# Patient Record
Sex: Female | Born: 1940
Health system: Southern US, Community
[De-identification: ages and names within clinical notes are randomized; demographics above are authoritative.]

## PROBLEM LIST (undated history)

## (undated) DIAGNOSIS — K5732 Diverticulitis of large intestine without perforation or abscess without bleeding: Secondary | ICD-10-CM

## (undated) DIAGNOSIS — R0602 Shortness of breath: Secondary | ICD-10-CM

## (undated) DIAGNOSIS — K219 Gastro-esophageal reflux disease without esophagitis: Secondary | ICD-10-CM

## (undated) DIAGNOSIS — R0600 Dyspnea, unspecified: Secondary | ICD-10-CM

## (undated) DIAGNOSIS — M1990 Unspecified osteoarthritis, unspecified site: Secondary | ICD-10-CM

## (undated) DIAGNOSIS — N6019 Diffuse cystic mastopathy of unspecified breast: Secondary | ICD-10-CM

## (undated) DIAGNOSIS — R9439 Abnormal result of other cardiovascular function study: Secondary | ICD-10-CM

## (undated) DIAGNOSIS — M8580 Other specified disorders of bone density and structure, unspecified site: Secondary | ICD-10-CM

## (undated) DIAGNOSIS — G459 Transient cerebral ischemic attack, unspecified: Secondary | ICD-10-CM

## (undated) DIAGNOSIS — Z860100 Personal history of colon polyps, unspecified: Secondary | ICD-10-CM

## (undated) DIAGNOSIS — L719 Rosacea, unspecified: Secondary | ICD-10-CM

## (undated) DIAGNOSIS — C801 Malignant (primary) neoplasm, unspecified: Secondary | ICD-10-CM

## (undated) DIAGNOSIS — I10 Essential (primary) hypertension: Secondary | ICD-10-CM

## (undated) DIAGNOSIS — R0609 Other forms of dyspnea: Secondary | ICD-10-CM

## (undated) DIAGNOSIS — E785 Hyperlipidemia, unspecified: Secondary | ICD-10-CM

## (undated) HISTORY — DX: Dyspnea, unspecified: R06.00

## (undated) HISTORY — DX: Essential (primary) hypertension: I10

## (undated) HISTORY — DX: Shortness of breath: R06.02

## (undated) HISTORY — DX: Gastro-esophageal reflux disease without esophagitis: K21.9

## (undated) HISTORY — DX: Diffuse cystic mastopathy of unspecified breast: N60.19

## (undated) HISTORY — DX: Other specified disorders of bone density and structure, unspecified site: M85.80

## (undated) HISTORY — DX: Unspecified osteoarthritis, unspecified site: M19.90

## (undated) HISTORY — DX: Transient cerebral ischemic attack, unspecified: G45.9

## (undated) HISTORY — DX: Abnormal result of other cardiovascular function study: R94.39

## (undated) HISTORY — PX: BREAST SURGERY: SHX581

## (undated) HISTORY — DX: Rosacea, unspecified: L71.9

## (undated) HISTORY — PX: BREAST ENHANCEMENT SURGERY: SHX7

## (undated) HISTORY — DX: Other forms of dyspnea: R06.09

## (undated) HISTORY — PX: MASTECTOMY: SHX3

## (undated) HISTORY — DX: Malignant (primary) neoplasm, unspecified: C80.1

## (undated) HISTORY — DX: Hyperlipidemia, unspecified: E78.5

## (undated) HISTORY — DX: Diverticulitis of large intestine without perforation or abscess without bleeding: K57.32

## (undated) HISTORY — DX: Personal history of colonic polyps: Z86.010

## (undated) HISTORY — DX: Personal history of colon polyps, unspecified: Z86.0100

---

## 1989-05-06 HISTORY — PX: BREAST LUMPECTOMY: SHX2

## 1997-09-19 ENCOUNTER — Ambulatory Visit (HOSPITAL_BASED_OUTPATIENT_CLINIC_OR_DEPARTMENT_OTHER): Admission: RE | Admit: 1997-09-19 | Discharge: 1997-09-19 | Payer: Self-pay

## 1997-09-22 ENCOUNTER — Encounter: Admission: RE | Admit: 1997-09-22 | Discharge: 1997-12-21 | Payer: Self-pay | Admitting: *Deleted

## 1997-09-30 ENCOUNTER — Ambulatory Visit (HOSPITAL_COMMUNITY): Admission: RE | Admit: 1997-09-30 | Discharge: 1997-10-01 | Payer: Self-pay

## 1998-01-27 ENCOUNTER — Ambulatory Visit (HOSPITAL_BASED_OUTPATIENT_CLINIC_OR_DEPARTMENT_OTHER): Admission: RE | Admit: 1998-01-27 | Discharge: 1998-01-27 | Payer: Self-pay | Admitting: Specialist

## 1998-05-03 ENCOUNTER — Emergency Department (HOSPITAL_COMMUNITY): Admission: EM | Admit: 1998-05-03 | Discharge: 1998-05-03 | Payer: Self-pay | Admitting: Emergency Medicine

## 1998-08-07 ENCOUNTER — Ambulatory Visit (HOSPITAL_BASED_OUTPATIENT_CLINIC_OR_DEPARTMENT_OTHER): Admission: RE | Admit: 1998-08-07 | Discharge: 1998-08-07 | Payer: Self-pay | Admitting: Specialist

## 1999-01-01 ENCOUNTER — Ambulatory Visit (HOSPITAL_BASED_OUTPATIENT_CLINIC_OR_DEPARTMENT_OTHER): Admission: RE | Admit: 1999-01-01 | Discharge: 1999-01-01 | Payer: Self-pay | Admitting: Specialist

## 1999-03-15 ENCOUNTER — Ambulatory Visit (HOSPITAL_BASED_OUTPATIENT_CLINIC_OR_DEPARTMENT_OTHER): Admission: RE | Admit: 1999-03-15 | Discharge: 1999-03-15 | Payer: Self-pay | Admitting: Plastic Surgery

## 2001-06-18 ENCOUNTER — Other Ambulatory Visit: Admission: RE | Admit: 2001-06-18 | Discharge: 2001-06-18 | Payer: Self-pay | Admitting: Gynecology

## 2002-06-24 ENCOUNTER — Other Ambulatory Visit: Admission: RE | Admit: 2002-06-24 | Discharge: 2002-06-24 | Payer: Self-pay | Admitting: Gynecology

## 2003-06-30 ENCOUNTER — Other Ambulatory Visit: Admission: RE | Admit: 2003-06-30 | Discharge: 2003-06-30 | Payer: Self-pay | Admitting: Gynecology

## 2003-07-21 ENCOUNTER — Encounter (INDEPENDENT_AMBULATORY_CARE_PROVIDER_SITE_OTHER): Payer: Self-pay | Admitting: Specialist

## 2003-07-21 ENCOUNTER — Ambulatory Visit (HOSPITAL_COMMUNITY): Admission: RE | Admit: 2003-07-21 | Discharge: 2003-07-21 | Payer: Self-pay | Admitting: Gastroenterology

## 2004-07-02 ENCOUNTER — Other Ambulatory Visit: Admission: RE | Admit: 2004-07-02 | Discharge: 2004-07-02 | Payer: Self-pay | Admitting: Gynecology

## 2005-07-03 ENCOUNTER — Other Ambulatory Visit: Admission: RE | Admit: 2005-07-03 | Discharge: 2005-07-03 | Payer: Self-pay | Admitting: Gynecology

## 2006-07-04 ENCOUNTER — Other Ambulatory Visit: Admission: RE | Admit: 2006-07-04 | Discharge: 2006-07-04 | Payer: Self-pay | Admitting: Gynecology

## 2007-07-06 ENCOUNTER — Other Ambulatory Visit: Admission: RE | Admit: 2007-07-06 | Discharge: 2007-07-06 | Payer: Self-pay | Admitting: Gynecology

## 2008-10-05 ENCOUNTER — Encounter: Payer: Self-pay | Admitting: Gynecology

## 2008-10-05 ENCOUNTER — Other Ambulatory Visit: Admission: RE | Admit: 2008-10-05 | Discharge: 2008-10-05 | Payer: Self-pay | Admitting: Gynecology

## 2008-10-05 ENCOUNTER — Ambulatory Visit: Payer: Self-pay | Admitting: Gynecology

## 2009-11-09 ENCOUNTER — Other Ambulatory Visit: Admission: RE | Admit: 2009-11-09 | Discharge: 2009-11-09 | Payer: Self-pay | Admitting: Gynecology

## 2009-11-09 ENCOUNTER — Ambulatory Visit: Payer: Self-pay | Admitting: Gynecology

## 2010-09-21 NOTE — Op Note (Signed)
NAME:  Andrea Landry, Andrea Landry                         ACCOUNT NO.:  [id number]   MEDICAL RECORD NO.:  [id number]                   PATIENT TYPE:  AMB   LOCATION:  ENDO                                 FACILITY:  [facility]   PHYSICIAN:  Danise Edge, M.D.                DATE OF BIRTH:  1940-08-18   DATE OF PROCEDURE:  07/21/2003  DATE OF DISCHARGE:                                 OPERATIVE REPORT   PROCEDURE:  Colonoscopy and rectal polypectomy.   PROCEDURE INDICATION:  Ms. Andrea Landry is a 70 year old female, born  1940-09-01.  To evaluate intermittent hematochezia, Dr. Johnella Moloney  performed  a diagnostic flexible proctosigmoidoscopy on Ms. Andrea Landry and  discovered a 1 cm polyp in the distal rectum.   ENDOSCOPIST:  Charolett Bumpers, M.D.   PREMEDICATION:  1. Versed 5 mg.  2. Demerol 50 mg.   DESCRIPTION OF PROCEDURE:  After obtaining informed consent, Ms. Andrea Landry was  placed in the left lateral decubitus position.  I administered intravenous  Demerol and intravenous Versed to achieve conscious sedation for the  procedure.  The patient's blood pressure, oxygen saturation, and cardiac  rhythm were monitored throughout the procedure and documented in the medical  record.   Anal inspection was normal.  Digital rectal exam was normal.  I was unable  to feel the distal rectal polyp.  The Olympus adjustable pediatric  colonoscope was introduced into the rectum and advanced to the cecum.  Colonic preparation for the exam today was excellent.   RECTUM:  In the distal rectum, approximately 8-10 cm from the anal verge,  there is a 1 cm broad-based polyp.  The polyp was removed by electrocautery  snare.  The base of the polyp was injected with saline.  A second specimen  was taken from the rectal polyp base to ensure complete removal of the  distal rectal polyp.  Specimens were sent and labeled distal rectal polyp  base.  SIGMOID COLON AND DESCENDING COLON:  Normal.  SPLENIC FLEXURE:   Normal.  TRANSVERSE COLON:  Normal.  HEPATIC FLEXURE:  Normal.  ASCENDING COLON:  Normal.  CECUM AND ILEOCECAL VALVE:  Normal.   ASSESSMENT:  A distal rectal polyp was removed with the snare.  The polyp  base was injected with saline and a second specimen from the polyp base was  sent separately.                                               Danise Edge, M.D.    MJ/MEDQ  D:  07/21/2003  T:  07/21/2003  Job:  [id number]   cc:   Candyce Churn, M.D.  [address] [place]  [place] [address]  Fax: [phone]

## 2011-01-04 DIAGNOSIS — M8580 Other specified disorders of bone density and structure, unspecified site: Secondary | ICD-10-CM | POA: Insufficient documentation

## 2011-01-04 DIAGNOSIS — I10 Essential (primary) hypertension: Secondary | ICD-10-CM | POA: Insufficient documentation

## 2011-01-04 DIAGNOSIS — C801 Malignant (primary) neoplasm, unspecified: Secondary | ICD-10-CM | POA: Insufficient documentation

## 2011-01-11 ENCOUNTER — Encounter: Payer: Self-pay | Admitting: Gynecology

## 2011-01-11 ENCOUNTER — Ambulatory Visit (INDEPENDENT_AMBULATORY_CARE_PROVIDER_SITE_OTHER): Payer: Medicare Other | Admitting: Gynecology

## 2011-01-11 VITALS — BP 130/80 | Ht 67.75 in | Wt 163.0 lb

## 2011-01-11 DIAGNOSIS — M8580 Other specified disorders of bone density and structure, unspecified site: Secondary | ICD-10-CM

## 2011-01-11 DIAGNOSIS — R82998 Other abnormal findings in urine: Secondary | ICD-10-CM

## 2011-01-11 DIAGNOSIS — Z853 Personal history of malignant neoplasm of breast: Secondary | ICD-10-CM

## 2011-01-11 DIAGNOSIS — M899 Disorder of bone, unspecified: Secondary | ICD-10-CM

## 2011-01-11 DIAGNOSIS — N952 Postmenopausal atrophic vaginitis: Secondary | ICD-10-CM

## 2011-01-11 DIAGNOSIS — R3915 Urgency of urination: Secondary | ICD-10-CM

## 2011-01-11 NOTE — Progress Notes (Signed)
Andrea Landry 03/16/1941 [id number]        70 y.o.  For followup. History of osteopenia, breast cancer. Patient is complaining of urgency during the day with urination multiple times. No significant incontinence only gets up once at night.  Past medical history,surgical history, medications, allergies, family history and social history were all reviewed and documented in the EPIC chart. ROS:  Was performed and pertinent positives and negatives are included in the history.  Exam: chaperone present Filed Vitals:   01/11/11 1549  BP: 130/80   General appearance  Normal Skin grossly normal Head/Neck normal with no cervical or supraclavicular adenopathy thyroid normal Lungs  clear Cardiac RR, without RMG Abdominal  soft, nontender, without masses, organomegaly or hernia Breasts  examined lying and sitting.  Left without masses, retractions, discharge or axillary adenopathy.  Right status post mastectomy with reconstruction no acute changes, masses or axillary adenopathy Pelvic  Ext/BUS/vagina  normal atrophic genital changes  Cervix  normal  atrophic  Uterus  anteverted, normal size, shape and contour, midline and mobile nontender   Adnexa  Without masses or tenderness    Anus and perineum  normal   Rectovaginal  normal sphincter tone without palpated masses or tenderness.    Assessment/Plan:  70 y.o. female followup. #1 History of osteopenia. Bone density study September 2010 showed osteopenia with a -1.5 the left femoral neck. She's been stable over the last number of years on repeat bone density and I recommended that we repeat this again in another year or 2. Increase calcium vitamin D discussed. #2 Breast cancer. Patient has no evidence of disease. She's due for her mammogram this month and knows to followup for this.  Self breast exams on the basis discussed encouraged. #3 Urgency symptoms. We'll check urinalysis today. She is on a diuretic for her hypertension. She only gets up  once at night for voiding. I discussed options to include trial of overactive bladder medication. She does not want to do this for fear of side effects. She is comfortable with just watching for now assuming that her urine is negative. #4 Atrophic vaginitis. Patient asymptomatic we'll continue to monitor. #5 Health maintenance. Patient will get her mammogram this month. Is up-to-date with colonoscopy that she arranges for Dr. Kevan Ny office. No blood work was done today this is all done through their office. I did not do a Pap smear today as she has had normal routine Pap smears in the past and never had a history of abnormality. She did have a normal Pap smear last year. I discussed the newer recommendations and discussed that we could stop screening since she is over age 13 without history of abnormal Pap smears. We'll rediscuss this next year.  Assuming she continues well from a gynecologic standpoint and she'll see [place] in a year.   Dara Lords MD, 4:40 PM 01/11/2011

## 2011-01-14 ENCOUNTER — Telehealth: Payer: Self-pay | Admitting: Gynecology

## 2011-01-14 DIAGNOSIS — N390 Urinary tract infection, site not specified: Secondary | ICD-10-CM

## 2011-01-14 MED ORDER — SULFAMETHOXAZOLE-TRIMETHOPRIM 800-160 MG PO TABS: 1.0000 | ORAL_TABLET | Freq: Two times a day (BID) | ORAL | Status: AC

## 2011-01-14 NOTE — Telephone Encounter (Signed)
Tell pt low level of bacteria in urine.  I want to cover with septra DS 1po bid X 3days

## 2011-01-15 NOTE — Telephone Encounter (Signed)
Pt informed with the below note. 

## 2011-01-15 NOTE — Telephone Encounter (Signed)
L/M FOR PT TO CALL.

## 2011-02-04 ENCOUNTER — Encounter: Payer: Self-pay | Admitting: Gynecology

## 2011-05-07 HISTORY — PX: OTHER SURGICAL HISTORY: SHX169

## 2011-11-13 ENCOUNTER — Other Ambulatory Visit: Payer: Self-pay | Admitting: Gastroenterology

## 2012-01-29 ENCOUNTER — Encounter: Payer: Self-pay | Admitting: Gynecology

## 2012-01-29 ENCOUNTER — Ambulatory Visit (INDEPENDENT_AMBULATORY_CARE_PROVIDER_SITE_OTHER): Payer: Medicare Other | Admitting: Gynecology

## 2012-01-29 VITALS — BP 120/72 | Ht 67.5 in | Wt 166.0 lb

## 2012-01-29 DIAGNOSIS — C50919 Malignant neoplasm of unspecified site of unspecified female breast: Secondary | ICD-10-CM

## 2012-01-29 DIAGNOSIS — M899 Disorder of bone, unspecified: Secondary | ICD-10-CM

## 2012-01-29 DIAGNOSIS — Z780 Asymptomatic menopausal state: Secondary | ICD-10-CM

## 2012-01-29 DIAGNOSIS — M8580 Other specified disorders of bone density and structure, unspecified site: Secondary | ICD-10-CM

## 2012-01-29 DIAGNOSIS — N952 Postmenopausal atrophic vaginitis: Secondary | ICD-10-CM

## 2012-01-29 DIAGNOSIS — M949 Disorder of cartilage, unspecified: Secondary | ICD-10-CM

## 2012-01-29 NOTE — Patient Instructions (Signed)
Follow up in one year for annual exam 

## 2012-01-29 NOTE — Progress Notes (Signed)
Andrea Landry 1940/06/12 [id number]        70 y.o.  G2P1002 for annual follow up exam.  Several issues noted below.  Past medical history,surgical history, medications, allergies, family history and social history were all reviewed and documented in the EPIC chart. ROS:  Was performed and pertinent positives and negatives are included in the history.  Exam: Andrea Landry assistant Filed Vitals:   01/29/12 0921  BP: 120/72  Height: 5' 7.5" (1.715 m)  Weight: 166 lb (75.297 kg)   General appearance  Normal Skin grossly normal Head/Neck normal with no cervical or supraclavicular adenopathy thyroid normal Lungs  clear Cardiac RR, without RMG Abdominal  soft, nontender, without masses, organomegaly or hernia Breasts  examined lying and sitting. Right status post mastectomy with reconstruction. Scars well healed. No visual/palpable abnormalities with implant noted. No axillary adenopathy. Left breast status post reduction well-healed scars. No masses, retractions, discharge, adenopathy. Pelvic  Ext/BUS/vagina  normal  With atrophic changes  Cervix  normal atrophic   Uterus  axial, normal size, shape and contour, midline and mobile nontender   Adnexa  Without masses or tenderness    Anus and perineum  normal   Rectovaginal  normal sphincter tone without palpated masses or tenderness.    Assessment/Plan:  71 y.o. G2P1002 female .   1. Postmenopausal/atrophic vaginitis. Without significant symptoms. We'll continue to monitor. 2. History of breast cancer. Exam is normal status post mastectomy/reconstruction.  Do from a mammogram next month and knows to schedule this. SBE reviewed. 3. Pap smear. No Pap smear done today. Last Pap smear 2011. Numerous normal reports in her chart with no history of abnormal Pap smears before. Review current screening guidelines and will stop screening as she is over the age of 64. 4. Osteopenia. DEXA 01/2009 at [facility] shows T score -1.5 no FRAX.  Recommend repeat now a  3 year interval and she agrees to arrange this with her mammogram next month. Increase calcium vitamin D reviewed. 5. Colonoscopy. Patient had colonoscopy this past year which was normal. 6. Health maintenance. No blood work was done today this is all done through her primary physician's office. Follow up one year, sooner as needed.    Dara Lords MD, 10:21 AM 01/29/2012

## 2012-02-26 ENCOUNTER — Encounter: Payer: Self-pay | Admitting: Gynecology

## 2012-03-25 ENCOUNTER — Encounter: Payer: Self-pay | Admitting: Gynecology

## 2013-02-01 ENCOUNTER — Ambulatory Visit (INDEPENDENT_AMBULATORY_CARE_PROVIDER_SITE_OTHER): Payer: Medicare Other | Admitting: Gynecology

## 2013-02-01 ENCOUNTER — Encounter: Payer: Self-pay | Admitting: Gynecology

## 2013-02-01 VITALS — BP 120/70 | Ht 67.0 in | Wt 164.0 lb

## 2013-02-01 DIAGNOSIS — M8580 Other specified disorders of bone density and structure, unspecified site: Secondary | ICD-10-CM

## 2013-02-01 DIAGNOSIS — N952 Postmenopausal atrophic vaginitis: Secondary | ICD-10-CM

## 2013-02-01 DIAGNOSIS — C50911 Malignant neoplasm of unspecified site of right female breast: Secondary | ICD-10-CM

## 2013-02-01 DIAGNOSIS — C50919 Malignant neoplasm of unspecified site of unspecified female breast: Secondary | ICD-10-CM

## 2013-02-01 DIAGNOSIS — M899 Disorder of bone, unspecified: Secondary | ICD-10-CM

## 2013-02-01 NOTE — Patient Instructions (Signed)
Follow up in one year for annual exam 

## 2013-02-01 NOTE — Progress Notes (Signed)
  Andrea Landry Jul 02, 1940 [id number]        72 y.o.  G3P0102 for followup exam. Several issues noted below.  Past medical history,surgical history, medications, allergies, family history and social history were all reviewed and documented in the EPIC chart.  ROS:  Performed and pertinent positives and negatives are included in the history, assessment and plan .  Exam: Kim assistant Filed Vitals:   02/01/13 1428  BP: 120/70  Height: 5\' 7"  (1.702 m)  Weight: 164 lb (74.39 kg)   General appearance  Normal Skin grossly normal Head/Neck normal with no cervical or supraclavicular adenopathy thyroid normal Lungs  clear Cardiac RR, without RMG Abdominal  soft, nontender, without masses, organomegaly or hernia Breasts  examined lying and sitting. Left without masses, retractions, discharge or axillary adenopathy. Well-healed reduction scars. Right status post mastectomy with reconstruction. No masses or axillary adenopathy. Pelvic  Ext/BUS/vagina  normal with atrophic changes  Cervix  normal with atrophic changes  Uterus  anteverted, normal size, shape and contour, midline and mobile nontender   Adnexa  Without masses or tenderness    Anus and perineum  normal   Rectovaginal  normal sphincter tone without palpated masses or tenderness.    Assessment/Plan:  72 y.o. G72P0102 female for annual exam.   1. Postmenopausal. Without significant hot flashes, night sweats, vaginal dryness. Not sexually active. Will continue to monitor. Patient does report any bleeding. 2. Breast cancer. NED. Has mammogram scheduled. SBE monthly reviewed. 3. Osteopenia. DEXA 03/2012 with T score -1.4. [facility] 10%/1.5%. Plan repeat DEXA next year at two-year interval. 4. Colonoscopy 2012. Repeat at their recommended interval. 5. Pap smear 2011. No Pap smear done today. No history of abnormal Pap smears previously. Reviewed current screening guidelines and we both agree to stop screening. 6. Health maintenance. No  blood work done as it is all done through her primary physician's office. Followup one year, sooner as needed.  Note: This document was prepared with digital dictation and possible smart phrase technology. Any transcriptional errors that result from this process are unintentional.   Dara Lords MD, 3:03 PM 02/01/2013

## 2013-03-01 ENCOUNTER — Encounter: Payer: Self-pay | Admitting: Gynecology

## 2014-02-02 ENCOUNTER — Ambulatory Visit (INDEPENDENT_AMBULATORY_CARE_PROVIDER_SITE_OTHER): Payer: Commercial Managed Care - HMO | Admitting: Gynecology

## 2014-02-02 ENCOUNTER — Other Ambulatory Visit (HOSPITAL_COMMUNITY)
Admission: RE | Admit: 2014-02-02 | Discharge: 2014-02-02 | Disposition: A | Discharge status: Home / Self Care | Payer: Medicare PPO | Source: Ambulatory Visit | Attending: Gynecology | Admitting: Gynecology

## 2014-02-02 ENCOUNTER — Encounter: Payer: Self-pay | Admitting: Gynecology

## 2014-02-02 VITALS — BP 126/80 | Ht 67.0 in | Wt 169.0 lb

## 2014-02-02 DIAGNOSIS — Z124 Encounter for screening for malignant neoplasm of cervix: Secondary | ICD-10-CM | POA: Diagnosis present

## 2014-02-02 DIAGNOSIS — M949 Disorder of cartilage, unspecified: Secondary | ICD-10-CM

## 2014-02-02 DIAGNOSIS — M8580 Other specified disorders of bone density and structure, unspecified site: Secondary | ICD-10-CM

## 2014-02-02 DIAGNOSIS — M899 Disorder of bone, unspecified: Secondary | ICD-10-CM

## 2014-02-02 DIAGNOSIS — C50919 Malignant neoplasm of unspecified site of unspecified female breast: Secondary | ICD-10-CM

## 2014-02-02 DIAGNOSIS — N952 Postmenopausal atrophic vaginitis: Secondary | ICD-10-CM

## 2014-02-02 NOTE — Addendum Note (Signed)
Addended by: Nelva Nay on: 02/02/2014 03:05 PM   Modules accepted: Orders

## 2014-02-02 NOTE — Progress Notes (Signed)
ZAHRIA DING 1940-06-04 [id number]        73 y.o.  G3P0102 for follow up exam. Several issues noted below.  Past medical history,surgical history, problem list, medications, allergies, family history and social history were all reviewed and documented as reviewed in the EPIC chart.  ROS:  12 system ROS performed with pertinent positives and negatives included in the history, assessment and plan.   Additional significant findings :  Fatigue.   Exam: Kim assistant Filed Vitals:   02/02/14 1428  BP: 126/80  Height: 5\' 7"  (1.702 m)  Weight: 169 lb (76.658 kg)   General appearance:  Normal affect, orientation and appearance. Skin: Grossly normal HEENT: Without gross lesions.  No cervical or supraclavicular adenopathy. Thyroid normal.  Lungs:  Clear without wheezing, rales or rhonchi Cardiac: RR, without RMG Abdominal:  Soft, nontender, without masses, guarding, rebound, organomegaly or hernia Breasts:  Examined lying and sitting. Left without masses, retractions, discharge or axillary adenopathy.  Right status post reconstruction with implant. No masses or adenopathy Pelvic:  Ext/BUS/vagina with atrophic changes  Cervix with atrophic changes. Pap done  Uterus anteverted, normal size, shape and contour, midline and mobile nontender   Adnexa  Without masses or tenderness    Anus and perineum  Normal   Rectovaginal  Normal sphincter tone without palpated masses or tenderness.    Assessment/Plan:  73 y.o. [id number] female for follow up exam.   1. Postmenopausal/atrophic genital changes. Patient without significant symptoms of hot plusses, night sweats, vaginal dryness or any vaginal bleeding. Continue to monitor. Report any vaginal bleeding. 2. History of breast cancer status post mastectomy with reconstruction. Exam NED.  Mammography coming due in October and I reminded her to schedule this. SBE monthly reviewed. 3. Pap smear 2011. Pap smear done today at patient's request. I reviewed  current screening guidelines and options to stop screen altogether she is over the age of 73 and has no history of significant abnormalities. Patient is uncomfortable with this and prefers to be screened. 4. Colonoscopy 2012. Repeat at their recommended interval. 5. Osteopenia. DEXA 2013 T score -1.4 FRAX 10%/1.5%. Repeat DEXA now and patient will schedule. Increase calcium vitamin D reviewed. 6. Health maintenance. No routine blood work done as patient reports this is done through her primary physician's office. Follow up in one year, sooner as needed.     Anastasio Auerbach MD, 2:51 PM 02/02/2014

## 2014-02-02 NOTE — Patient Instructions (Signed)
You may obtain a copy of any labs that were done today by logging onto MyChart as outlined in the instructions provided with your AVS (after visit summary). The office will not call with normal lab results but certainly if there are any significant abnormalities then we will contact you.   Health Maintenance, Female A healthy lifestyle and preventative care can promote health and wellness.  Maintain regular health, dental, and eye exams.  Eat a healthy diet. Foods like vegetables, fruits, whole grains, low-fat dairy products, and lean protein foods contain the nutrients you need without too many calories. Decrease your intake of foods high in solid fats, added sugars, and salt. Get information about a proper diet from your caregiver, if necessary.  Regular physical exercise is one of the most important things you can do for your health. Most adults should get at least 150 minutes of moderate-intensity exercise (any activity that increases your heart rate and causes you to sweat) each week. In addition, most adults need muscle-strengthening exercises on 2 or more days a week.   Maintain a healthy weight. The body mass index (BMI) is a screening tool to identify possible weight problems. It provides an estimate of body fat based on height and weight. Your caregiver can help determine your BMI, and can help you achieve or maintain a healthy weight. For adults 20 years and older:  A BMI below 18.5 is considered underweight.  A BMI of 18.5 to 24.9 is normal.  A BMI of 25 to 29.9 is considered overweight.  A BMI of 30 and above is considered obese.  Maintain normal blood lipids and cholesterol by exercising and minimizing your intake of saturated fat. Eat a balanced diet with plenty of fruits and vegetables. Blood tests for lipids and cholesterol should begin at age 61 and be repeated every 5 years. If your lipid or cholesterol levels are high, you are over 50, or you are a high risk for heart  disease, you may need your cholesterol levels checked more frequently.Ongoing high lipid and cholesterol levels should be treated with medicines if diet and exercise are not effective.  If you smoke, find out from your caregiver how to quit. If you do not use tobacco, do not start.  Lung cancer screening is recommended for adults aged 33 80 years who are at high risk for developing lung cancer because of a history of smoking. Yearly low-dose computed tomography (CT) is recommended for people who have at least a 30-pack-year history of smoking and are a current smoker or have quit within the past 15 years. A pack year of smoking is smoking an average of 1 pack of cigarettes a day for 1 year (for example: 1 pack a day for 30 years or 2 packs a day for 15 years). Yearly screening should continue until the smoker has stopped smoking for at least 15 years. Yearly screening should also be stopped for people who develop a health problem that would prevent them from having lung cancer treatment.  If you are pregnant, do not drink alcohol. If you are breastfeeding, be very cautious about drinking alcohol. If you are not pregnant and choose to drink alcohol, do not exceed 1 drink per day. One drink is considered to be 12 ounces (355 mL) of beer, 5 ounces (148 mL) of wine, or 1.5 ounces (44 mL) of liquor.  Avoid use of street drugs. Do not share needles with anyone. Ask for help if you need support or instructions about stopping  the use of drugs.  High blood pressure causes heart disease and increases the risk of stroke. Blood pressure should be checked at least every 1 to 2 years. Ongoing high blood pressure should be treated with medicines, if weight loss and exercise are not effective.  If you are 59 to 73 years old, ask your caregiver if you should take aspirin to prevent strokes.  Diabetes screening involves taking a blood sample to check your fasting blood sugar level. This should be done once every 3  years, after age 91, if you are within normal weight and without risk factors for diabetes. Testing should be considered at a younger age or be carried out more frequently if you are overweight and have at least 1 risk factor for diabetes.  Breast cancer screening is essential preventative care for women. You should practice "breast self-awareness." This means understanding the normal appearance and feel of your breasts and may include breast self-examination. Any changes detected, no matter how small, should be reported to a caregiver. Women in their 66s and 30s should have a clinical breast exam (CBE) by a caregiver as part of a regular health exam every 1 to 3 years. After age 101, women should have a CBE every year. Starting at age 100, women should consider having a mammogram (breast X-ray) every year. Women who have a family history of breast cancer should talk to their caregiver about genetic screening. Women at a high risk of breast cancer should talk to their caregiver about having an MRI and a mammogram every year.  Breast cancer gene (BRCA)-related cancer risk assessment is recommended for women who have family members with BRCA-related cancers. BRCA-related cancers include breast, ovarian, tubal, and peritoneal cancers. Having family members with these cancers may be associated with an increased risk for harmful changes (mutations) in the breast cancer genes BRCA1 and BRCA2. Results of the assessment will determine the need for genetic counseling and BRCA1 and BRCA2 testing.  The Pap test is a screening test for cervical cancer. Women should have a Pap test starting at age 57. Between ages 25 and 35, Pap tests should be repeated every 2 years. Beginning at age 37, you should have a Pap test every 3 years as long as the past 3 Pap tests have been normal. If you had a hysterectomy for a problem that was not cancer or a condition that could lead to cancer, then you no longer need Pap tests. If you are  between ages 50 and 76, and you have had normal Pap tests going back 10 years, you no longer need Pap tests. If you have had past treatment for cervical cancer or a condition that could lead to cancer, you need Pap tests and screening for cancer for at least 20 years after your treatment. If Pap tests have been discontinued, risk factors (such as a new sexual partner) need to be reassessed to determine if screening should be resumed. Some women have medical problems that increase the chance of getting cervical cancer. In these cases, your caregiver may recommend more frequent screening and Pap tests.  The human papillomavirus (HPV) test is an additional test that may be used for cervical cancer screening. The HPV test looks for the virus that can cause the cell changes on the cervix. The cells collected during the Pap test can be tested for HPV. The HPV test could be used to screen women aged 44 years and older, and should be used in women of any age  who have unclear Pap test results. After the age of 30, women should have HPV testing at the same frequency as a Pap test.  Colorectal cancer can be detected and often prevented. Most routine colorectal cancer screening begins at the age of 74 and continues through age 6. However, your caregiver may recommend screening at an earlier age if you have risk factors for colon cancer. On a yearly basis, your caregiver may provide home test kits to check for hidden blood in the stool. Use of a small camera at the end of a tube, to directly examine the colon (sigmoidoscopy or colonoscopy), can detect the earliest forms of colorectal cancer. Talk to your caregiver about this at age 29, when routine screening begins. Direct examination of the colon should be repeated every 5 to 10 years through age 77, unless early forms of pre-cancerous polyps or small growths are found.  Hepatitis C blood testing is recommended for all people born from [birth year] through 1965 and any  individual with known risks for hepatitis C.  Practice safe sex. Use condoms and avoid high-risk sexual practices to reduce the spread of sexually transmitted infections (STIs). Sexually active women aged 42 and younger should be checked for Chlamydia, which is a common sexually transmitted infection. Older women with new or multiple partners should also be tested for Chlamydia. Testing for other STIs is recommended if you are sexually active and at increased risk.  Osteoporosis is a disease in which the bones lose minerals and strength with aging. This can result in serious bone fractures. The risk of osteoporosis can be identified using a bone density scan. Women ages 25 and over and women at risk for fractures or osteoporosis should discuss screening with their caregivers. Ask your caregiver whether you should be taking a calcium supplement or vitamin D to reduce the rate of osteoporosis.  Menopause can be associated with physical symptoms and risks. Hormone replacement therapy is available to decrease symptoms and risks. You should talk to your caregiver about whether hormone replacement therapy is right for you.  Use sunscreen. Apply sunscreen liberally and repeatedly throughout the day. You should seek shade when your shadow is shorter than you. Protect yourself by wearing long sleeves, pants, a wide-brimmed hat, and sunglasses year round, whenever you are outdoors.  Notify your caregiver of new moles or changes in moles, especially if there is a change in shape or color. Also notify your caregiver if a mole is larger than the size of a pencil eraser.  Stay current with your immunizations. Document Released: 11/05/2010 Document Revised: 08/17/2012 Document Reviewed: 11/05/2010 [facility] Patient Information 2014 [place].

## 2014-02-04 LAB — CYTOLOGY - PAP

## 2014-03-06 DIAGNOSIS — M8580 Other specified disorders of bone density and structure, unspecified site: Secondary | ICD-10-CM

## 2014-03-06 HISTORY — DX: Other specified disorders of bone density and structure, unspecified site: M85.80

## 2014-03-07 ENCOUNTER — Encounter: Payer: Self-pay | Admitting: Gynecology

## 2014-03-15 ENCOUNTER — Encounter: Payer: Self-pay | Admitting: Gynecology

## 2014-03-22 ENCOUNTER — Other Ambulatory Visit: Payer: Self-pay | Admitting: Anesthesiology

## 2014-03-22 DIAGNOSIS — M8580 Other specified disorders of bone density and structure, unspecified site: Secondary | ICD-10-CM

## 2014-03-23 ENCOUNTER — Encounter: Payer: Self-pay | Admitting: Gynecology

## 2014-07-05 DIAGNOSIS — M79674 Pain in right toe(s): Secondary | ICD-10-CM | POA: Diagnosis not present

## 2014-07-11 DIAGNOSIS — H35341 Macular cyst, hole, or pseudohole, right eye: Secondary | ICD-10-CM | POA: Diagnosis not present

## 2015-01-05 DIAGNOSIS — H04123 Dry eye syndrome of bilateral lacrimal glands: Secondary | ICD-10-CM | POA: Diagnosis not present

## 2015-01-05 DIAGNOSIS — H35363 Drusen (degenerative) of macula, bilateral: Secondary | ICD-10-CM | POA: Diagnosis not present

## 2015-01-05 DIAGNOSIS — H2513 Age-related nuclear cataract, bilateral: Secondary | ICD-10-CM | POA: Diagnosis not present

## 2015-01-05 DIAGNOSIS — H35341 Macular cyst, hole, or pseudohole, right eye: Secondary | ICD-10-CM | POA: Diagnosis not present

## 2015-02-06 ENCOUNTER — Encounter: Payer: Commercial Managed Care - HMO | Admitting: Gynecology

## 2015-02-16 ENCOUNTER — Encounter: Payer: Commercial Managed Care - HMO | Admitting: Gynecology

## 2015-03-20 DIAGNOSIS — Z853 Personal history of malignant neoplasm of breast: Secondary | ICD-10-CM | POA: Diagnosis not present

## 2015-03-20 DIAGNOSIS — Z1231 Encounter for screening mammogram for malignant neoplasm of breast: Secondary | ICD-10-CM | POA: Diagnosis not present

## 2015-03-22 ENCOUNTER — Encounter: Payer: Self-pay | Admitting: Gynecology

## 2015-05-19 DIAGNOSIS — G4700 Insomnia, unspecified: Secondary | ICD-10-CM | POA: Diagnosis not present

## 2015-05-19 DIAGNOSIS — I10 Essential (primary) hypertension: Secondary | ICD-10-CM | POA: Diagnosis not present

## 2015-05-19 DIAGNOSIS — M8580 Other specified disorders of bone density and structure, unspecified site: Secondary | ICD-10-CM | POA: Diagnosis not present

## 2015-05-19 DIAGNOSIS — M859 Disorder of bone density and structure, unspecified: Secondary | ICD-10-CM | POA: Diagnosis not present

## 2015-05-19 DIAGNOSIS — Z79899 Other long term (current) drug therapy: Secondary | ICD-10-CM | POA: Diagnosis not present

## 2015-05-19 DIAGNOSIS — Z1389 Encounter for screening for other disorder: Secondary | ICD-10-CM | POA: Diagnosis not present

## 2015-05-19 DIAGNOSIS — Z0001 Encounter for general adult medical examination with abnormal findings: Secondary | ICD-10-CM | POA: Diagnosis not present

## 2015-05-19 DIAGNOSIS — K219 Gastro-esophageal reflux disease without esophagitis: Secondary | ICD-10-CM | POA: Diagnosis not present

## 2015-08-04 DIAGNOSIS — H35341 Macular cyst, hole, or pseudohole, right eye: Secondary | ICD-10-CM | POA: Diagnosis not present

## 2016-02-05 ENCOUNTER — Encounter: Payer: Commercial Managed Care - HMO | Admitting: Gynecology

## 2016-02-05 DIAGNOSIS — R5383 Other fatigue: Secondary | ICD-10-CM | POA: Diagnosis not present

## 2016-02-05 DIAGNOSIS — H35341 Macular cyst, hole, or pseudohole, right eye: Secondary | ICD-10-CM | POA: Diagnosis not present

## 2016-02-05 DIAGNOSIS — R52 Pain, unspecified: Secondary | ICD-10-CM | POA: Diagnosis not present

## 2016-02-05 DIAGNOSIS — H35363 Drusen (degenerative) of macula, bilateral: Secondary | ICD-10-CM | POA: Diagnosis not present

## 2016-02-05 DIAGNOSIS — H25043 Posterior subcapsular polar age-related cataract, bilateral: Secondary | ICD-10-CM | POA: Diagnosis not present

## 2016-02-05 DIAGNOSIS — H2513 Age-related nuclear cataract, bilateral: Secondary | ICD-10-CM | POA: Diagnosis not present

## 2016-02-12 DIAGNOSIS — R634 Abnormal weight loss: Secondary | ICD-10-CM | POA: Diagnosis not present

## 2016-02-12 DIAGNOSIS — R5383 Other fatigue: Secondary | ICD-10-CM | POA: Diagnosis not present

## 2016-02-13 ENCOUNTER — Other Ambulatory Visit: Payer: Self-pay | Admitting: Internal Medicine

## 2016-02-13 ENCOUNTER — Ambulatory Visit
Admission: RE | Admit: 2016-02-13 | Discharge: 2016-02-13 | Disposition: A | Discharge status: Home / Self Care | Payer: Commercial Managed Care - HMO | Source: Ambulatory Visit | Attending: Internal Medicine | Admitting: Internal Medicine

## 2016-02-13 DIAGNOSIS — R634 Abnormal weight loss: Secondary | ICD-10-CM | POA: Diagnosis not present

## 2016-02-13 DIAGNOSIS — R630 Anorexia: Secondary | ICD-10-CM

## 2016-02-13 DIAGNOSIS — R6883 Chills (without fever): Secondary | ICD-10-CM

## 2016-02-20 DIAGNOSIS — R888 Abnormal findings in other body fluids and substances: Secondary | ICD-10-CM | POA: Diagnosis not present

## 2016-02-20 DIAGNOSIS — R634 Abnormal weight loss: Secondary | ICD-10-CM | POA: Diagnosis not present

## 2016-02-20 DIAGNOSIS — N390 Urinary tract infection, site not specified: Secondary | ICD-10-CM | POA: Diagnosis not present

## 2016-02-20 DIAGNOSIS — R6883 Chills (without fever): Secondary | ICD-10-CM | POA: Diagnosis not present

## 2016-02-20 DIAGNOSIS — R630 Anorexia: Secondary | ICD-10-CM | POA: Diagnosis not present

## 2016-02-22 ENCOUNTER — Ambulatory Visit
Admission: RE | Admit: 2016-02-22 | Discharge: 2016-02-22 | Disposition: A | Discharge status: Home / Self Care | Payer: Commercial Managed Care - HMO | Source: Ambulatory Visit | Attending: Internal Medicine | Admitting: Internal Medicine

## 2016-02-22 DIAGNOSIS — N281 Cyst of kidney, acquired: Secondary | ICD-10-CM | POA: Diagnosis not present

## 2016-02-22 DIAGNOSIS — R6883 Chills (without fever): Secondary | ICD-10-CM

## 2016-02-22 DIAGNOSIS — R634 Abnormal weight loss: Secondary | ICD-10-CM

## 2016-03-20 DIAGNOSIS — Z1231 Encounter for screening mammogram for malignant neoplasm of breast: Secondary | ICD-10-CM | POA: Diagnosis not present

## 2016-03-20 DIAGNOSIS — Z853 Personal history of malignant neoplasm of breast: Secondary | ICD-10-CM | POA: Diagnosis not present

## 2016-06-24 DIAGNOSIS — Z1389 Encounter for screening for other disorder: Secondary | ICD-10-CM | POA: Diagnosis not present

## 2016-06-24 DIAGNOSIS — Z0000 Encounter for general adult medical examination without abnormal findings: Secondary | ICD-10-CM | POA: Diagnosis not present

## 2016-06-24 DIAGNOSIS — K219 Gastro-esophageal reflux disease without esophagitis: Secondary | ICD-10-CM | POA: Diagnosis not present

## 2016-06-24 DIAGNOSIS — Z79899 Other long term (current) drug therapy: Secondary | ICD-10-CM | POA: Diagnosis not present

## 2016-06-24 DIAGNOSIS — M859 Disorder of bone density and structure, unspecified: Secondary | ICD-10-CM | POA: Diagnosis not present

## 2016-06-24 DIAGNOSIS — D126 Benign neoplasm of colon, unspecified: Secondary | ICD-10-CM | POA: Diagnosis not present

## 2016-06-24 DIAGNOSIS — I10 Essential (primary) hypertension: Secondary | ICD-10-CM | POA: Diagnosis not present

## 2016-06-24 DIAGNOSIS — M8580 Other specified disorders of bone density and structure, unspecified site: Secondary | ICD-10-CM | POA: Diagnosis not present

## 2016-07-22 DIAGNOSIS — M8589 Other specified disorders of bone density and structure, multiple sites: Secondary | ICD-10-CM | POA: Diagnosis not present

## 2016-07-30 ENCOUNTER — Other Ambulatory Visit: Payer: Self-pay | Admitting: Internal Medicine

## 2016-07-30 DIAGNOSIS — I10 Essential (primary) hypertension: Secondary | ICD-10-CM | POA: Diagnosis not present

## 2016-07-30 DIAGNOSIS — R410 Disorientation, unspecified: Secondary | ICD-10-CM

## 2016-07-30 DIAGNOSIS — R200 Anesthesia of skin: Secondary | ICD-10-CM | POA: Diagnosis not present

## 2016-07-31 ENCOUNTER — Ambulatory Visit
Admission: RE | Admit: 2016-07-31 | Discharge: 2016-07-31 | Disposition: A | Discharge status: Home / Self Care | Payer: Commercial Managed Care - HMO | Source: Ambulatory Visit | Attending: Internal Medicine | Admitting: Internal Medicine

## 2016-07-31 ENCOUNTER — Encounter (HOSPITAL_COMMUNITY): Payer: Self-pay

## 2016-07-31 ENCOUNTER — Emergency Department (HOSPITAL_COMMUNITY)
Admission: EM | Admit: 2016-07-31 | Discharge: 2016-08-01 | Disposition: A | Discharge status: Home / Self Care | Payer: Commercial Managed Care - HMO | Attending: Emergency Medicine | Admitting: Emergency Medicine

## 2016-07-31 DIAGNOSIS — Z79899 Other long term (current) drug therapy: Secondary | ICD-10-CM | POA: Insufficient documentation

## 2016-07-31 DIAGNOSIS — I10 Essential (primary) hypertension: Secondary | ICD-10-CM | POA: Insufficient documentation

## 2016-07-31 DIAGNOSIS — L299 Pruritus, unspecified: Secondary | ICD-10-CM | POA: Diagnosis not present

## 2016-07-31 DIAGNOSIS — R253 Fasciculation: Secondary | ICD-10-CM | POA: Diagnosis not present

## 2016-07-31 DIAGNOSIS — Z7982 Long term (current) use of aspirin: Secondary | ICD-10-CM | POA: Diagnosis not present

## 2016-07-31 DIAGNOSIS — Z853 Personal history of malignant neoplasm of breast: Secondary | ICD-10-CM | POA: Diagnosis not present

## 2016-07-31 DIAGNOSIS — R410 Disorientation, unspecified: Secondary | ICD-10-CM | POA: Diagnosis not present

## 2016-07-31 DIAGNOSIS — Z87891 Personal history of nicotine dependence: Secondary | ICD-10-CM | POA: Insufficient documentation

## 2016-07-31 DIAGNOSIS — G514 Facial myokymia: Secondary | ICD-10-CM

## 2016-07-31 LAB — BASIC METABOLIC PANEL
Anion gap: 8 (ref 5–15)
BUN: 17 mg/dL (ref 6–20)
CALCIUM: 9.6 mg/dL (ref 8.9–10.3)
CO2: 28 mmol/L (ref 22–32)
CREATININE: 0.8 mg/dL (ref 0.44–1.00)
Chloride: 105 mmol/L (ref 101–111)
GFR calc Af Amer: >60 mL/min (ref >60)
Glucose, Bld: 106 mg/dL — ABNORMAL HIGH (ref 65–99)
POTASSIUM: 3.9 mmol/L (ref 3.5–5.1)
SODIUM: 141 mmol/L (ref 135–145)

## 2016-07-31 LAB — CBC WITH DIFFERENTIAL/PLATELET
BASOS ABS: 0.1 10*3/uL (ref 0.0–0.1)
Basophils Relative: 1 %
EOS ABS: 0.2 10*3/uL (ref 0.0–0.7)
EOS PCT: 1 %
HCT: 46.1 % — ABNORMAL HIGH (ref 36.0–46.0)
Hemoglobin: 15.3 g/dL — ABNORMAL HIGH (ref 12.0–15.0)
LYMPHS PCT: 19 %
Lymphs Abs: 2 10*3/uL (ref 0.7–4.0)
MCH: 27.6 pg (ref 26.0–34.0)
MCHC: 33.2 g/dL (ref 30.0–36.0)
MCV: 83.1 fL (ref 78.0–100.0)
MONO ABS: 1 10*3/uL (ref 0.1–1.0)
Monocytes Relative: 10 %
Neutro Abs: 7.5 10*3/uL (ref 1.7–7.7)
Neutrophils Relative %: 69 %
PLATELETS: 657 10*3/uL — AB (ref 150–400)
RBC: 5.55 MIL/uL — AB (ref 3.87–5.11)
RDW: 14.6 % (ref 11.5–15.5)
WBC: 10.8 10*3/uL — AB (ref 4.0–10.5)

## 2016-07-31 LAB — MAGNESIUM: Magnesium: 1.9 mg/dL (ref 1.7–2.4)

## 2016-07-31 NOTE — ED Triage Notes (Signed)
Pt was seen at her primary yesterday and sent for an MRI today, as the evening went on her face began to twitch more and she didn't know the results of the MRI Pt has some slight finger numbness ans the twitch and slight mouth droop on the right side of her face

## 2016-07-31 NOTE — ED Provider Notes (Signed)
[facility] DEPT Provider Note   CSN: [id number] Arrival date & time: 07/31/16  2143   By signing my name below, I, Collene Leyden, attest that this documentation has been prepared under the direction and in the presence of Sherwood Gambler, MD. Electronically Signed: Collene Leyden, Scribe. 07/31/16. 11:20 PM.  History   Chief Complaint Chief Complaint  Patient presents with  . facial twitch   HPI Comments: Andrea Landry is a 76 y.o. female with a history of breast cancer, HTN, and osteopenia, who presents to the Emergency Department complaining of sudden-onset, intermittent facial twitching that began earlier today. Patient reports facial twitching, more than usual after having an MRI earlier today. Patient was seen by her primary care physician earlier today, in which she was noted to have an unremarkable MRI. Patient reports associated slurred speech, tongue swelling, lip numbness, raised left lip, and right 4th and 5th digit numbness. No modifying factors indicated. Patient states her medication was recently changed. Patient denies any fever, recent sickness, nausea, vomiting, LE weakness, LE numbness, neck pain, ear pain, or ear ringing.   Patient is also complaining of nasal congestion that began 3 weeks ago. Patient reports associated "course voice".   The history is provided by the patient. No language interpreter was used.    Past Medical History:  Diagnosis Date  . Cancer (HCC)    BREAST CANCER  . Hypertension   . Osteopenia 03/2014   T score -1.4 FRAX 10%/1.8%. No change from prior DEXA    Patient Active Problem List   Diagnosis Date Noted  . Hypertension   . Osteopenia   . Cancer [facility])     Past Surgical History:  Procedure Laterality Date  . BREAST ENHANCEMENT SURGERY     FOLLOWING MASTECTOMY FOR BREAST CANCER  . BREAST SURGERY     MULTIPLE BREAST BIOPSIES,   . MASTECTOMY     RIGHT BREAST  . right hammer toe and Bunion  2013    OB History    Gravida  Para Term Preterm AB Living   3 2   1   2    SAB TAB Ectopic Multiple Live Births                   Home Medications    Prior to Admission medications   Medication Sig Start Date End Date Taking? Authorizing Provider  aspirin 81 MG chewable tablet Take 1 tablet by mouth daily 07/30/16  Yes Historical Provider, MD  cholecalciferol (VITAMIN D) 1000 units tablet Take 1 tablet by mouth daily   Yes Historical Provider, MD  clonazePAM (KLONOPIN) 0.5 MG tablet Take 1 tablet by mouth daily as needed for anxiety or sleep   Yes Historical Provider, MD  esomeprazole (NEXIUM 24HR) 20 MG capsule Take 1 capsule by mouth daily   Yes Historical Provider, MD  metoprolol tartrate (LOPRESSOR) 25 MG tablet Take 25 mg by mouth daily.    Yes Historical Provider, MD  Multiple Vitamins-Minerals (MULTIVITAMIN ADULT) TABS Take 1 tablet by mouth daily   Yes Historical Provider, MD  triamterene-hydrochlorothiazide (MAXZIDE-25) 37.5-25 MG tablet Take 0.5 tablets by mouth daily.  06/19/16  Yes Historical Provider, MD    Family History Family History  Problem Relation Age of Onset  . Cancer Mother     RENAL    Social History Social History  Substance Use Topics  . Smoking status: Former Research scientist (life sciences)  . Smokeless tobacco: Never Used  . Alcohol use 5.0 oz/week    10 drink(s)  per week     Allergies   Lisinopril   Review of Systems Review of Systems  Constitutional: Negative for fever.  HENT: Positive for congestion. Negative for ear pain.        Facial twitching.  Gastrointestinal: Negative for nausea and vomiting.  Musculoskeletal: Negative for neck pain.  Neurological: Positive for speech difficulty and numbness (4th and 5th digit of the right hand). Negative for dizziness.  All other systems reviewed and are negative.    Physical Exam Updated Vital Signs Pulse 97   Temp 98 F (36.7 C) (Oral)   Resp 18   LMP 01/11/1996   SpO2 98%   Physical Exam  Constitutional: She is oriented to person,  place, and time. She appears well-developed and well-nourished.  HENT:  Head: Normocephalic and atraumatic.  Right Ear: External ear normal.  Left Ear: External ear normal.  Nose: Nose normal.  Eyes: EOM are normal. Pupils are equal, round, and reactive to light. Right eye exhibits no discharge. Left eye exhibits no discharge.  Neck: Normal range of motion. Neck supple.  Cardiovascular: Normal rate, regular rhythm and normal heart sounds.   Pulmonary/Chest: Effort normal and breath sounds normal.  Abdominal: Soft. There is no tenderness.  Neurological: She is alert and oriented to person, place, and time.  Cranial nerves 3-12 grossly intact, including facial sensation and movement. Intermittent right cheek facial muscle twitching. 5/5 strength in all four extremities. Grossly normal sensation. Normal finger to nose.   Skin: Skin is warm and dry.  Nursing note and vitals reviewed.    ED Treatments / Results  DIAGNOSTIC STUDIES: Oxygen Saturation is 98% on RA, normal by my interpretation.    COORDINATION OF CARE: 11:17 PM Discussed treatment plan with pt at bedside and pt agreed to plan, which includes blood work and a neurology consult.   Labs (all labs ordered are listed, but only abnormal results are displayed) Labs Reviewed  BASIC METABOLIC PANEL - Abnormal; Notable for the following:       Result Value   Glucose, Bld 106 (*)    All other components within normal limits  CBC WITH DIFFERENTIAL/PLATELET - Abnormal; Notable for the following:    WBC 10.8 (*)    RBC 5.55 (*)    Hemoglobin 15.3 (*)    HCT 46.1 (*)    Platelets 657 (*)    All other components within normal limits  MAGNESIUM    EKG  EKG Interpretation None       Radiology Mr Brain Wo Contrast  Result Date: 07/31/2016 CLINICAL DATA:  Confusion. Numbness in fingers and lips. History breast cancer. Hypertension. EXAM: MRI HEAD WITHOUT CONTRAST TECHNIQUE: Multiplanar, multiecho pulse sequences of the brain  and surrounding structures were obtained without intravenous contrast. COMPARISON:  None. FINDINGS: Brain: Ventricle size normal. Cerebral volume normal for age. Negative for acute infarct. Multiple small hyperintensities throughout the cerebral white matter bilaterally. Small hyperintensities in the basal ganglia bilaterally. Brainstem normal. Negative for hemorrhage or fluid collection. Negative for mass or edema. Vascular: Normal arterial flow void. Skull and upper cervical spine: Negative Sinuses/Orbits: Mucosal edema paranasal sinuses.  Normal orbit. Other: None IMPRESSION: Mild to moderate chronic microvascular ischemic changes in the white matter. No acute intracranial abnormality Mild mucosal edema in the paranasal sinuses. Electronically Signed   By: Franchot Gallo M.D.   On: 07/31/2016 21:00    Procedures Procedures (including critical care time)  Medications Ordered in ED Medications - No data to display   Initial  Impression / Assessment and Plan / ED Course  I have reviewed the triage vital signs and the nursing notes.  Pertinent labs & imaging results that were available during my care of the patient were reviewed by me and considered in my medical decision making (see chart for details).  Clinical Course as of Aug 02 1813  Wed Jul 31, 2016  2331 MRI from earlier in the day reassuring. Will check labs.  [SG]  Thu Aug 01, 2016  0025 Patient's labs, including K, mag, Ca are unremarkable. Unclear cause of facial twitch, but patient now tells me that she started amlodipine and has had 1 dose so far. Possibly a reaction/dystonia? No indication of acute neuro emergency. Besides local twitching, no neuro deficits. Does not have a droop or bell's palsy. D/w dr. Cheral Marker of neuro, no further workup, refer to PCP, possible outpatient workup by neuro if not improving.  [SG]    Clinical Course User Index [SG] Sherwood Gambler, MD    Patient has klonopin at home, rarely uses, discussed trying  to see if this relieves twitching. Usually takes benadryl prior to bed, this could also help (try one or other). Call pcp to switch amlodipine as this is possible cause given time course.  Final Clinical Impressions(s) / ED Diagnoses   Final diagnoses:  Facial twitching    New Prescriptions New Prescriptions   No medications on file   I personally performed the services described in this documentation, which was scribed in my presence. The recorded information has been reviewed and is accurate.     Sherwood Gambler, MD 08/02/16 [phone]

## 2016-08-01 DIAGNOSIS — G514 Facial myokymia: Secondary | ICD-10-CM | POA: Diagnosis not present

## 2016-08-01 DIAGNOSIS — R200 Anesthesia of skin: Secondary | ICD-10-CM | POA: Diagnosis not present

## 2016-08-01 DIAGNOSIS — R410 Disorientation, unspecified: Secondary | ICD-10-CM | POA: Diagnosis not present

## 2016-08-05 ENCOUNTER — Encounter: Payer: Self-pay | Admitting: Neurology

## 2016-08-06 DIAGNOSIS — H35341 Macular cyst, hole, or pseudohole, right eye: Secondary | ICD-10-CM | POA: Diagnosis not present

## 2016-08-06 DIAGNOSIS — H35363 Drusen (degenerative) of macula, bilateral: Secondary | ICD-10-CM | POA: Diagnosis not present

## 2016-08-07 ENCOUNTER — Other Ambulatory Visit: Payer: Self-pay | Admitting: Internal Medicine

## 2016-08-07 DIAGNOSIS — G514 Facial myokymia: Secondary | ICD-10-CM | POA: Diagnosis not present

## 2016-08-07 DIAGNOSIS — Z79899 Other long term (current) drug therapy: Secondary | ICD-10-CM | POA: Diagnosis not present

## 2016-08-07 DIAGNOSIS — R200 Anesthesia of skin: Secondary | ICD-10-CM

## 2016-08-07 DIAGNOSIS — I10 Essential (primary) hypertension: Secondary | ICD-10-CM | POA: Diagnosis not present

## 2016-08-07 DIAGNOSIS — R410 Disorientation, unspecified: Secondary | ICD-10-CM | POA: Diagnosis not present

## 2016-08-13 ENCOUNTER — Ambulatory Visit
Admission: RE | Admit: 2016-08-13 | Discharge: 2016-08-13 | Disposition: A | Discharge status: Home / Self Care | Payer: Commercial Managed Care - HMO | Source: Ambulatory Visit | Attending: Internal Medicine | Admitting: Internal Medicine

## 2016-08-13 DIAGNOSIS — I6523 Occlusion and stenosis of bilateral carotid arteries: Secondary | ICD-10-CM | POA: Diagnosis not present

## 2016-08-13 DIAGNOSIS — R200 Anesthesia of skin: Secondary | ICD-10-CM

## 2016-08-16 ENCOUNTER — Ambulatory Visit (INDEPENDENT_AMBULATORY_CARE_PROVIDER_SITE_OTHER): Payer: Commercial Managed Care - HMO | Admitting: Neurology

## 2016-08-16 ENCOUNTER — Encounter: Payer: Self-pay | Admitting: Neurology

## 2016-08-16 VITALS — BP 138/68 | HR 66 | Ht 67.0 in | Wt 153.0 lb

## 2016-08-16 DIAGNOSIS — R209 Unspecified disturbances of skin sensation: Secondary | ICD-10-CM | POA: Diagnosis not present

## 2016-08-16 DIAGNOSIS — R7989 Other specified abnormal findings of blood chemistry: Secondary | ICD-10-CM

## 2016-08-16 DIAGNOSIS — R202 Paresthesia of skin: Secondary | ICD-10-CM

## 2016-08-16 DIAGNOSIS — D473 Essential (hemorrhagic) thrombocythemia: Secondary | ICD-10-CM | POA: Diagnosis not present

## 2016-08-16 DIAGNOSIS — R4701 Aphasia: Secondary | ICD-10-CM | POA: Diagnosis not present

## 2016-08-16 NOTE — Progress Notes (Signed)
Note routed

## 2016-08-16 NOTE — Progress Notes (Signed)
[facility] Clinic Note - Initial Visit   Date: 08/16/16  Andrea Landry MRN: [id number] DOB: Dec 18, 1940   Dear Dr. Inda Merlin:  Thank you for your kind referral of Andrea Landry for consultation of left facial twitching. Although herhistory is well known to you, please allow [place] to reiterate it for the purpose of our medical record. The patient was accompanied to the clinic by self.    History of Present Illness: Andrea Landry is a 76 y.o. right-handed Caucasian female with GERD, hypertension, history of breast cancer s/p right mastectomy (1999) presenting for evaluation of facial paresthesias and twitching and speech changes.    She was at church on March 25th and while talking, she developed numbness over the right corner of her lips and 4th and 5th digit on the right. At the same time, she was unable to express words and unable to spell when typing. She was still able to comprehend what was being said.  Symptoms lasted 30-minutes and completely resolved, but then would recur in the same day.  She was having ongoing symptoms almost daily until April 8th, these stereotyped spells could occur 5-10 times per day, lasting anywhere from minutes to several hours.  She described her speech difficulty as being "confused", but upon further questioning, denies disorientation, but moreso describes that she did not know what was happening to her during these events.There was no loss of consciousness or fatigue. On March 28th, she developed right cheek twitching which was constant for 3-days.  She went to the ER on 3/28 for these symptoms where MRI brain was ordered which did not show anything worrisome, specifically no sign of acute stroke or mass, there is scattered white matter changes. Of note, her CBC showed elevated platelet count of 647.  She followed up with her PCP who ordered US carotids which was normal and had repeat CBC with platelets 584.  I am not sure what her  historical trend of platelets has been.  She was also started on aspirin 35m daily.   Currently, she denies any new symptoms and has been symptom-free for the past 5-days.  No headache, changes in vision, or limb weakness.  She had had two UTIs in the past 641-month but reports being asymptomatic from this and is concerned that her presentation may be due to another UTI.  Out-side paper records, electronic medical record, and images have been reviewed where available and summarized as:  [place] 08/13/2016:  No significant stenosis  MRI brain wo contrast 07/31/2016:  Mild to moderate chronic microvascular ischemic changes in the white matter. No acute intracranial abnormality.  Mild mucosal edema in the paranasal sinuses.  Lab Results  Component Value Date   WBC 10.8 (H) 07/31/2016   HGB 15.3 (H) 07/31/2016   HCT 46.1 (H) 07/31/2016   MCV 83.1 07/31/2016   PLT 657 (H) 07/31/2016   Labs 08/07/2016:  WBC 7.7, RBC 5.16, Hgb 15.2, Hct 45.3, MCV 87.8, PLT 584*; ESR 1; CMP normal  Past Medical History:  Diagnosis Date  . Cancer (HCC)    BREAST CANCER  . Hypertension   . Osteopenia 03/2014   T score -1.4 FRAX 10%/1.8%. No change from prior DEXA    Past Surgical History:  Procedure Laterality Date  . BREAST ENHANCEMENT SURGERY     FOLLOWING MASTECTOMY FOR BREAST CANCER  . BREAST SURGERY     MULTIPLE BREAST BIOPSIES,   . MASTECTOMY     RIGHT BREAST  . right  hammer toe and Bunion  2013     Medications:  Outpatient Encounter Prescriptions as of 08/16/2016  Medication Sig  . aspirin 81 MG chewable tablet Take 1 tablet by mouth daily  . cholecalciferol (VITAMIN D) 1000 units tablet Take 1 tablet by mouth daily  . clonazePAM (KLONOPIN) 0.5 MG tablet Take 1 tablet by mouth daily as needed for anxiety or sleep  . diphenhydrAMINE (BENADRYL) 25 MG tablet Take 25 mg by mouth every 6 (six) hours as needed.  Marland Kitchen esomeprazole (NEXIUM 24HR) 20 MG capsule Take 1 capsule by mouth daily  .  metoprolol tartrate (LOPRESSOR) 25 MG tablet Take 25 mg by mouth daily.   . Multiple Vitamins-Minerals (MULTIVITAMIN ADULT) TABS Take 1 tablet by mouth daily  . triamterene-hydrochlorothiazide (MAXZIDE-25) 37.5-25 MG tablet Take 0.5 tablets by mouth daily.    No facility-administered encounter medications on file as of 08/16/2016.      Allergies:  Allergies  Allergen Reactions  . Lisinopril     Family History: Family History  Problem Relation Age of Onset  . Cancer Mother     RENAL  . Other Father   . Cerebral palsy Daughter   . Healthy Son     Social History: Social History  Substance Use Topics  . Smoking status: Former Smoker    Packs/day: 1.00    Years: 40.00    Quit date: 24  . Smokeless tobacco: Never Used  . Alcohol use 5.0 oz/week    10 Standard drinks or equivalent per week     Comment: 1-2 glasses per night   Social History   Social History Narrative   Lives with husband in a 2 story home.  Has 2 children.     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     Education: college.     Review of Systems:  CONSTITUTIONAL: No fevers, chills, night sweats, or weight loss.   EYES: No visual changes or eye pain ENT: No hearing changes.  No history of nose bleeds.   RESPIRATORY: No cough, wheezing and shortness of breath.   CARDIOVASCULAR: Negative for chest pain, and palpitations.   GI: Negative for abdominal discomfort, blood in stools or black stools.  No recent change in bowel habits.   GU:  No history of incontinence.   MUSCLOSKELETAL: No history of joint pain or swelling.  No myalgias.   SKIN: Negative for lesions, rash, and itching.   HEMATOLOGY/ONCOLOGY: Negative for prolonged bleeding, bruising easily, and swollen nodes.  +history of cancer.   ENDOCRINE: Negative for cold or heat intolerance, polydipsia or goiter.   PSYCH:  No depression or anxiety symptoms.   NEURO: As Above.   Vital Signs:  BP 138/68   Pulse 66   Ht 5' 7"  (1.702 m)   Wt 153  lb (69.4 kg)   LMP 01/11/1996   SpO2 96%   BMI 23.96 kg/m    General Medical Exam:   General:  Well appearing, comfortable.   Eyes/ENT: see cranial nerve examination.   Neck: No masses appreciated.  Full range of motion without tenderness.  No carotid bruits. Respiratory:  Clear to auscultation, good air entry bilaterally.   Cardiac:  Regular rate and rhythm, no murmur.   Extremities:  No deformities, edema, or skin discoloration.  Skin:  No rashes or lesions.  Neurological Exam: MENTAL STATUS including orientation to time, place, person, recent and remote memory, attention span and concentration, language, and fund of knowledge is normal.  Speech is not dysarthric.  Comprehension, repetition, and naming is intact.  Lingual and gutteral sounds are normal.   CRANIAL NERVES: II:  No visual field defects.  Unremarkable fundi.   III-IV-VI: Pupils equal round and reactive to light.  Normal conjugate, extra-ocular eye movements in all directions of gaze.  No nystagmus.  No ptosis.   V:  Normal facial sensation.  Jaw jerk is absent.   VII:  Normal facial symmetry and movements.  No pathologic facial reflexes.  VIII:  Normal hearing and vestibular function.   IX-X:  Normal palatal movement.   XI:  Normal shoulder shrug and head rotation.   XII:  Normal tongue strength and range of motion, no deviation or fasciculation.  MOTOR:  No atrophy, fasciculations or abnormal movements.  No pronator drift.  Tone is normal.    Right Upper Extremity:    Left Upper Extremity:    Deltoid  5/5   Deltoid  5/5   Biceps  5/5   Biceps  5/5   Triceps  5/5   Triceps  5/5   Wrist extensors  5/5   Wrist extensors  5/5   Wrist flexors  5/5   Wrist flexors  5/5   Finger extensors  5/5   Finger extensors  5/5   Finger flexors  5/5   Finger flexors  5/5   Dorsal interossei  5/5   Dorsal interossei  5/5   Abductor pollicis  5/5   Abductor pollicis  5/5   Tone (Ashworth scale)  0  Tone (Ashworth scale)  0    Right Lower Extremity:    Left Lower Extremity:    Hip flexors  5/5   Hip flexors  5/5   Hip extensors  5/5   Hip extensors  5/5   Knee flexors  5/5   Knee flexors  5/5   Knee extensors  5/5   Knee extensors  5/5   Dorsiflexors  5/5   Dorsiflexors  5/5   Plantarflexors  5/5   Plantarflexors  5/5   Toe extensors  5/5   Toe extensors  5/5   Toe flexors  5/5   Toe flexors  5/5   Tone (Ashworth scale)  0  Tone (Ashworth scale)  0   MSRs:  Right                                                                 Left brachioradialis 2+  brachioradialis 2+  biceps 2+  biceps 2+  triceps 2+  triceps 2+  patellar 2+  patellar 2+  ankle jerk 2+  ankle jerk 2+  Hoffman no  Hoffman no  plantar response down  plantar response down   SENSORY:  Normal and symmetric perception of light touch, pinprick, vibration, and proprioception.  COORDINATION/GAIT: Normal finger-to- nose-finger .  Intact rapid alternating movements bilaterally.  Able to rise from a chair without using arms.  Gait narrow based and stable. Very mild unsteadiness with tandem gait. Stressed gait intact.    IMPRESSION: Mrs. Chaudhari is a 76 year-old female referred for evaluation of right facial paresthesias, right hand paresthesias, and expressive aphasia.  Her exam is entirely normal and non-focal.  MRI brain was personally reviewed with patient which shows age-appropriate scattered white matter changes, no acute stroke or  space-occupying lesion.  Her symptoms localize to the left fronto-parietal lobe.  With the episodic nature of her symptoms, I will evaluate for intracranial stenosis with MRA head.  Further, with her elevated platelet count, MRV head will also be ordered to exclude thrombosis, but I would expect headaches with his presentation which she denies.  If her imaging returns nondiagnostic or she develops symptoms again, the next step will be EEG for her stereotyped spells and to evaluate for simple partial  seizures.   PLAN/RECOMMENDATIONS:  1.  Check fasting lipid panel, TSH, vitamin B12 2.  MRA and MRV head 3.  Routine EEG   4.  Recommend follow-up with her PCP for thrombocytosis 5.  Continue aspirin 8m  Return to clinic after above testing   The duration of this appointment visit was 45 minutes of face-to-face time with the patient.  Greater than 50% of this time was spent in counseling, explanation of diagnosis, planning of further management, and coordination of care.   Thank you for allowing me to participate in patient's care.  If I can answer any additional questions, I would be pleased to do so.    Sincerely,    Dollie Bressi K. PPosey Pronto DO

## 2016-08-16 NOTE — Patient Instructions (Addendum)
1.  We will order additional testing looking at the blood flow to and from your brain.  We will call you with these results.  2.  We will try to get the results of your labs from your primary care doctor.  Depending on the results, we may need to check a few additional ones.

## 2016-08-19 ENCOUNTER — Other Ambulatory Visit (INDEPENDENT_AMBULATORY_CARE_PROVIDER_SITE_OTHER): Payer: Commercial Managed Care - HMO

## 2016-08-19 DIAGNOSIS — R202 Paresthesia of skin: Secondary | ICD-10-CM

## 2016-08-19 DIAGNOSIS — R209 Unspecified disturbances of skin sensation: Secondary | ICD-10-CM

## 2016-08-19 DIAGNOSIS — R4701 Aphasia: Secondary | ICD-10-CM

## 2016-08-19 LAB — LIPID PANEL
CHOLESTEROL: 184 mg/dL (ref 0–200)
HDL: 55.7 mg/dL (ref >39.00)
LDL Cholesterol: 99 mg/dL (ref 0–99)
NonHDL: 128.15
TRIGLYCERIDES: 146 mg/dL (ref 0.0–149.0)
Total CHOL/HDL Ratio: 3
VLDL: 29.2 mg/dL (ref 0.0–40.0)

## 2016-08-19 LAB — TSH: TSH: 1.39 u[IU]/mL (ref 0.35–4.50)

## 2016-08-19 LAB — VITAMIN B12: VITAMIN B 12: 374 pg/mL (ref 211–911)

## 2016-08-20 ENCOUNTER — Telehealth: Payer: Self-pay | Admitting: *Deleted

## 2016-08-20 NOTE — Telephone Encounter (Signed)
-----   Message from Alda Berthold, DO sent at 08/19/2016 12:53 PM EDT ----- Labs are within normal limits.  Please inform patient that I also want routine EEG to see if her symptoms could be stemming from abnormal electrical activity, such as a seizure.  Thanks.

## 2016-08-20 NOTE — Telephone Encounter (Signed)
Left message for patient to call me back. 

## 2016-08-21 NOTE — Telephone Encounter (Addendum)
Called patient. Gave EEG results. Patient verbalized understanding. Transferred patient back to front office to schedule EEG.

## 2016-08-23 ENCOUNTER — Telehealth: Payer: Self-pay | Admitting: *Deleted

## 2016-08-23 NOTE — Telephone Encounter (Signed)
Called patient. No answer and no voicemail. 

## 2016-08-23 NOTE — Telephone Encounter (Signed)
-----   Message from Alda Berthold, DO sent at 08/19/2016 12:53 PM EDT ----- Labs are within normal limits.  Please inform patient that I also want routine EEG to see if her symptoms could be stemming from abnormal electrical activity, such as a seizure.  Thanks.

## 2016-08-26 ENCOUNTER — Telehealth: Payer: Self-pay | Admitting: Neurology

## 2016-08-26 NOTE — Telephone Encounter (Signed)
Peer to Peer

## 2016-08-26 NOTE — Telephone Encounter (Signed)
Dr. Syliva Overman would like to do a peer to peer review on this patient. She is having a MRA of the Brain. His direct # is [phone]. Thank you

## 2016-08-26 NOTE — Telephone Encounter (Signed)
Andrea Landry is supposed to fax approval number.  Andrea K. Posey Pronto, DO

## 2016-08-27 ENCOUNTER — Telehealth: Payer: Self-pay | Admitting: *Deleted

## 2016-08-27 NOTE — Telephone Encounter (Signed)
-----   Message from Alda Berthold, DO sent at 08/19/2016 12:53 PM EDT ----- Labs are within normal limits.  Please inform patient that I also want routine EEG to see if her symptoms could be stemming from abnormal electrical activity, such as a seizure.  Thanks.

## 2016-08-27 NOTE — Telephone Encounter (Signed)
Patient given results but can't do the EEG at any of the available times that we have offered.  She will be seeing her PCP on May 16 and will discuss this with him to see if she can have it done there.

## 2016-08-29 ENCOUNTER — Ambulatory Visit
Admission: RE | Admit: 2016-08-29 | Discharge: 2016-08-29 | Disposition: A | Discharge status: Home / Self Care | Payer: Commercial Managed Care - HMO | Source: Ambulatory Visit | Attending: Neurology | Admitting: Neurology

## 2016-08-29 DIAGNOSIS — R202 Paresthesia of skin: Secondary | ICD-10-CM

## 2016-08-29 DIAGNOSIS — R200 Anesthesia of skin: Secondary | ICD-10-CM | POA: Diagnosis not present

## 2016-08-29 DIAGNOSIS — R209 Unspecified disturbances of skin sensation: Secondary | ICD-10-CM

## 2016-08-29 DIAGNOSIS — R4701 Aphasia: Secondary | ICD-10-CM

## 2016-09-02 NOTE — Progress Notes (Signed)
Attempted to contact patient.  No answer and no voicemail.  Will try again.   

## 2016-09-03 ENCOUNTER — Telehealth: Payer: Self-pay | Admitting: *Deleted

## 2016-09-03 ENCOUNTER — Other Ambulatory Visit: Payer: Self-pay | Admitting: *Deleted

## 2016-09-03 DIAGNOSIS — I771 Stricture of artery: Secondary | ICD-10-CM

## 2016-09-03 NOTE — Telephone Encounter (Signed)
-----   Message from Alda Berthold, DO sent at 08/30/2016  4:41 PM EDT ----- Please inform patient that her MRA suggested that there may be some narrowing of the arterial circulation to the brain, and that I recommend getting CTA head to take a look at this better. Thanks

## 2016-09-03 NOTE — Telephone Encounter (Signed)
Left message for patient to call me

## 2016-09-03 NOTE — Telephone Encounter (Signed)
I spoke with patient.  She is in [place] until Sunday.  I scheduled her CTA for Tuesday, May 8 at 8:45.  She has been instructed not to have any solid foods 2 hours prior to test.  [place] CT phone number given to patient for directions and any further questions.

## 2016-09-10 ENCOUNTER — Ambulatory Visit (INDEPENDENT_AMBULATORY_CARE_PROVIDER_SITE_OTHER)
Admission: RE | Admit: 2016-09-10 | Discharge: 2016-09-10 | Disposition: A | Discharge status: Home / Self Care | Payer: Medicare HMO | Source: Ambulatory Visit | Attending: Neurology | Admitting: Neurology

## 2016-09-10 ENCOUNTER — Telehealth: Payer: Self-pay | Admitting: Neurology

## 2016-09-10 DIAGNOSIS — I669 Occlusion and stenosis of unspecified cerebral artery: Secondary | ICD-10-CM | POA: Diagnosis not present

## 2016-09-10 DIAGNOSIS — I771 Stricture of artery: Secondary | ICD-10-CM | POA: Diagnosis not present

## 2016-09-10 MED ORDER — ATORVASTATIN CALCIUM 40 MG PO TABS: 40.0000 mg | ORAL_TABLET | Freq: Every day | ORAL | 5 refills | Status: DC

## 2016-09-10 MED ORDER — CLOPIDOGREL BISULFATE 75 MG PO TABS: 75.0000 mg | ORAL_TABLET | Freq: Every day | ORAL | 5 refills | Status: DC

## 2016-09-10 MED ORDER — IOPAMIDOL (ISOVUE-370) INJECTION 76%
80.0000 mL | Freq: Once | INTRAVENOUS | Status: AC | PRN
  Administered 2016-09-10: 80 mL via INTRAVENOUS

## 2016-09-10 NOTE — Telephone Encounter (Signed)
Called patient with the results of CTA which shows critical distal M1 stenosis. She denies any further spells of right-sided facial paresthesias are arm paresthesias. She will be put on maximal medical management with dual antiplatelet therapy and statin. In addition to aspirin 81 mg, she was instructed to start Plavix 75 mg daily and atorvastatin 40 mg daily. Stroke warning signs were discussed. Should she develop any new neurological symptoms lateralizing to the right, she was instructed to go directly to the emergency department. Low threshold for cerebral angiogram if she has new symptoms.  We will also have asked her to follow-up with her PCP regarding the 4 cm thoracic mass, likely thyroid nodule.  Return to clinic in 1 month.  Keoshia Steinmetz K. Posey Pronto, DO

## 2016-09-18 DIAGNOSIS — I10 Essential (primary) hypertension: Secondary | ICD-10-CM | POA: Diagnosis not present

## 2016-09-18 DIAGNOSIS — R410 Disorientation, unspecified: Secondary | ICD-10-CM | POA: Diagnosis not present

## 2016-09-18 DIAGNOSIS — G514 Facial myokymia: Secondary | ICD-10-CM | POA: Diagnosis not present

## 2016-09-18 DIAGNOSIS — R200 Anesthesia of skin: Secondary | ICD-10-CM | POA: Diagnosis not present

## 2016-09-18 DIAGNOSIS — Z79899 Other long term (current) drug therapy: Secondary | ICD-10-CM | POA: Diagnosis not present

## 2016-10-10 ENCOUNTER — Telehealth: Payer: Self-pay | Admitting: Neurology

## 2016-10-10 NOTE — Telephone Encounter (Signed)
She can try atorvastatin 20mg  (half-tab) and see if this helps.   Zulay Corrie K. Posey Pronto, DO

## 2016-10-10 NOTE — Telephone Encounter (Signed)
Patient thinks that her aches are from her atorvastatin.  Can she have a lower dose?

## 2016-10-10 NOTE — Telephone Encounter (Signed)
Patient was returning your call. Thanks!  °

## 2016-10-10 NOTE — Telephone Encounter (Signed)
PT left a message that she is having muscle aches and would like a call back

## 2016-10-10 NOTE — Telephone Encounter (Signed)
Left message for Mrs. Bullis to call me back.

## 2016-10-11 NOTE — Telephone Encounter (Signed)
Patient was given instructions and agreed with plan.

## 2016-10-22 ENCOUNTER — Ambulatory Visit (INDEPENDENT_AMBULATORY_CARE_PROVIDER_SITE_OTHER): Payer: Medicare HMO | Admitting: Neurology

## 2016-10-22 ENCOUNTER — Encounter: Payer: Self-pay | Admitting: Neurology

## 2016-10-22 VITALS — BP 110/60 | HR 73 | Ht 67.0 in | Wt 151.3 lb

## 2016-10-22 DIAGNOSIS — E041 Nontoxic single thyroid nodule: Secondary | ICD-10-CM | POA: Diagnosis not present

## 2016-10-22 DIAGNOSIS — G459 Transient cerebral ischemic attack, unspecified: Secondary | ICD-10-CM | POA: Insufficient documentation

## 2016-10-22 DIAGNOSIS — G458 Other transient cerebral ischemic attacks and related syndromes: Secondary | ICD-10-CM

## 2016-10-22 DIAGNOSIS — I679 Cerebrovascular disease, unspecified: Secondary | ICD-10-CM

## 2016-10-22 MED ORDER — ATORVASTATIN CALCIUM 20 MG PO TABS: 20.0000 mg | ORAL_TABLET | Freq: Every day | ORAL | 3 refills | Status: DC

## 2016-10-22 NOTE — Progress Notes (Signed)
Follow-up Visit   Date: 10/22/16    Andrea Landry MRN: [id number] DOB: 29-Oct-1940   Interim History: Andrea Landry is a 76 y.o. right-handed Caucasian female with GERD, hypertension, history of breast cancer s/p right mastectomy (1999) returning to the clinic for follow-up of TIA manifesting with expressive aphasia.  The patient was accompanied to the clinic by self.  History of present illness: Initial visit 08/16/2016:  She was at church on March 25th and while talking, she developed numbness over the right corner of her lips and 4th and 5th digit on the right. At the same time, she was unable to express words and unable to spell when typing. She was still able to comprehend what was being said.  Symptoms lasted 30-minutes and completely resolved, but then would recur in the same day.  She was having ongoing symptoms almost daily until April 8th, these stereotyped spells could occur 5-10 times per day, lasting anywhere from minutes to several hours.  She described her speech difficulty as being "confused", but upon further questioning, denies disorientation, but moreso describes that she did not know what was happening to her during these events.There was no loss of consciousness or fatigue. On March 28th, she developed right cheek twitching which was constant for 3-days.  She went to the ER on 3/28 for these symptoms where MRI brain was ordered which did not show anything worrisome, specifically no sign of acute stroke or mass, there is scattered white matter changes. Of note, her CBC showed elevated platelet count of 647.  She followed up with her PCP who ordered US carotids which was normal and had repeat CBC with platelets 584.  I am not sure what her historical trend of platelets has been.  She was also started on aspirin 81mg  daily.   Currently, she denies any new symptoms and has been symptom-free for the past 5-days.  No headache, changes in vision, or limb weakness.  She had had  two UTIs in the past 75-months, but reports being asymptomatic from this and is concerned that her presentation may be due to another UTI.  UPDATE 10/22/2016:  She is here for follow-up visit and reports doing well without any new neurological symptoms or speech changes or facial paresthesias. She is compliant with plavix and aspirin which she takes for intracranial stenosis (left distal M1 stenosis).  Her atorvastatin was reduced to 20mg  daily due to myalgias.   She is exercising daily.    Medications:  Current Outpatient Prescriptions on File Prior to Visit  Medication Sig Dispense Refill  . aspirin 81 MG chewable tablet Take 1 tablet by mouth daily    . atorvastatin (LIPITOR) 40 MG tablet Take 1 tablet (40 mg total) by mouth daily. (Patient taking differently: Take 20 mg by mouth daily. ) 30 tablet 5  . cholecalciferol (VITAMIN D) 1000 units tablet Take 1 tablet by mouth daily    . clonazePAM (KLONOPIN) 0.5 MG tablet Take 1 tablet by mouth daily as needed for anxiety or sleep    . clopidogrel (PLAVIX) 75 MG tablet Take 1 tablet (75 mg total) by mouth daily. 30 tablet 5  . diphenhydrAMINE (BENADRYL) 25 MG tablet Take 25 mg by mouth every 6 (six) hours as needed.    Marland Kitchen esomeprazole (NEXIUM 24HR) 20 MG capsule Take 1 capsule by mouth daily    . metoprolol tartrate (LOPRESSOR) 25 MG tablet Take 25 mg by mouth daily.     . Multiple Vitamins-Minerals (MULTIVITAMIN ADULT)  TABS Take 1 tablet by mouth daily    . triamterene-hydrochlorothiazide (MAXZIDE-25) 37.5-25 MG tablet Take 0.5 tablets by mouth daily.      No current facility-administered medications on file prior to visit.     Allergies:  Allergies  Allergen Reactions  . Lisinopril     Review of Systems:  CONSTITUTIONAL: No fevers, chills, night sweats, or weight loss.  EYES: No visual changes or eye pain ENT: No hearing changes.  No history of nose bleeds.   RESPIRATORY: No cough, wheezing and shortness of breath.   CARDIOVASCULAR:  Negative for chest pain, and palpitations.   GI: Negative for abdominal discomfort, blood in stools or black stools.  No recent change in bowel habits.   GU:  No history of incontinence.   MUSCLOSKELETAL: No history of joint pain or swelling.  No myalgias.   SKIN: Negative for lesions, rash, and itching.   ENDOCRINE: Negative for cold or heat intolerance, polydipsia or goiter.   PSYCH:  No depression or anxiety symptoms.   NEURO: As Above.   Vital Signs:  BP 110/60   Pulse 73   Ht 5\' 7"  (1.702 m)   Wt 151 lb 5 oz (68.6 kg)   LMP 01/11/1996   SpO2 95%   BMI 23.70 kg/m   Neurological Exam: MENTAL STATUS including orientation to time, place, person, recent and remote memory, attention span and concentration, language, and fund of knowledge is normal.  Speech is not dysarthric.  Repetition is normal.   CRANIAL NERVES:  Pupils equal round and reactive to light.  Normal conjugate, extra-ocular eye movements in all directions of gaze.  No ptosis. Normal facial sensation.  Face is symmetric. Palate elevates symmetrically.  Tongue is midline.  MOTOR:  Motor strength is 5/5 in all extremities. No pronator drift.  Tone is normal.    MSRs:  Reflexes are 2+/4 throughout.  SENSORY:  Intact to vibration.  COORDINATION/GAIT:   Gait narrow based and stable.   Data: US carotids 08/13/2016:  No significant stenosis  MRI brain wo contrast 07/31/2016:  Mild to moderate chronic microvascular ischemic changes in the white matter. No acute intracranial abnormality.  Mild mucosal edema in the paranasal sinuses.  Routine EEG 08/16/2016: normal  CTA head 09/10/2016:  1. Critical distal left M1 segment stenosis. 2. Atheromatous type irregularity of left M2 branches. Elsewhere, intracranial vessels are unremarkable. 3. 4 cm thoracic inlet mass on a single slice through the chest for contrast timing. This is likely goiter or thyroid nodule, recommend sonography for confirmation.  MRA head 08/30/2016:     1. Abrupt signal loss of the distal M1 segment of the left MCA is favored to be artifactual, as the distal branches are normal. However, a high-grade stenosis would be difficult to exclude. CTA of the brain should be considered for definitive characterization. 2. Otherwise normal intracranial MRA.  IMPRESSION/PLAN: 1.  TIA manifesting with expression aphasia, right facial and hand paresthesias (07/2016)  - CTA which shows critical distal M1 stenosis.    - She was started on medical management with dual antiplatelet therapy and statin.   - Continue aspirin 81mg  and plavix for 3 more months, if no new neurological events, will continue monotherapy with plavix  - Unfortunately, she developed myalgias and reduced atorvastatin to 20mg  daily and is interested in trying 30mg  daily  - Stroke warning signs discussed. Low threshold for cerebral angiogram if she has new symptoms.  - She had many questions which I answered to the best of  my ability  2.  Thoracic mass, ?thyroid nodule, will request PCP to follow-up on this  Return to clinic in 3 months   The duration of this appointment visit was 25 minutes of face-to-face time with the patient.  Greater than 50% of this time was spent in counseling, explanation of diagnosis, planning of further management, and coordination of care.   Thank you for allowing me to participate in patient's care.  If I can answer any additional questions, I would be pleased to do so.    Sincerely,    Donika K. Posey Pronto, DO

## 2016-10-22 NOTE — Patient Instructions (Addendum)
Increase atorvastatin in 30mg  daily Continue aspirin and plavix Follow-up with your primary care doctor to evaluate the thyroid nodule  Return to clinic in September, 11 at 3pm.  Please arrive 15 minutes prior to appointment.    Know these warning signs of stroke. Every second counts:  Sudden numbness or weakness of the face, arm or leg, especially on one side of the body,  Sudden confusion, trouble speaking or understanding,  Sudden trouble seeing in one eye or both eyes,  Sudden trouble walking, dizziness, loss of balance or coordination,  Sudden severe headache with no known cause.  If you or someone with you has one or more of these signs, don't delay!  Immediately call [id number], or the emergency medical services (EMS) number so an ambulance (ideally with advanced life support) can be sent for you.  Also, check the time so that you will know when the symptoms first appeared. It's very important to take immediate action. Medical treatment may be available if action is taken early enough.

## 2016-12-11 DIAGNOSIS — Z86010 Personal history of colonic polyps: Secondary | ICD-10-CM | POA: Diagnosis not present

## 2016-12-11 DIAGNOSIS — K635 Polyp of colon: Secondary | ICD-10-CM | POA: Diagnosis not present

## 2016-12-11 DIAGNOSIS — K5730 Diverticulosis of large intestine without perforation or abscess without bleeding: Secondary | ICD-10-CM | POA: Diagnosis not present

## 2016-12-12 DIAGNOSIS — K635 Polyp of colon: Secondary | ICD-10-CM | POA: Diagnosis not present

## 2016-12-13 DIAGNOSIS — H35341 Macular cyst, hole, or pseudohole, right eye: Secondary | ICD-10-CM | POA: Diagnosis not present

## 2016-12-23 ENCOUNTER — Other Ambulatory Visit: Payer: Self-pay | Admitting: Internal Medicine

## 2016-12-23 DIAGNOSIS — E0789 Other specified disorders of thyroid: Secondary | ICD-10-CM | POA: Diagnosis not present

## 2016-12-23 DIAGNOSIS — R42 Dizziness and giddiness: Secondary | ICD-10-CM | POA: Diagnosis not present

## 2016-12-23 DIAGNOSIS — H671 Otitis media in diseases classified elsewhere, right ear: Secondary | ICD-10-CM | POA: Diagnosis not present

## 2016-12-23 DIAGNOSIS — E079 Disorder of thyroid, unspecified: Secondary | ICD-10-CM

## 2016-12-25 ENCOUNTER — Ambulatory Visit
Admission: RE | Admit: 2016-12-25 | Discharge: 2016-12-25 | Disposition: A | Discharge status: Home / Self Care | Payer: Medicare HMO | Source: Ambulatory Visit | Attending: Internal Medicine | Admitting: Internal Medicine

## 2016-12-25 DIAGNOSIS — E079 Disorder of thyroid, unspecified: Secondary | ICD-10-CM

## 2016-12-25 DIAGNOSIS — E042 Nontoxic multinodular goiter: Secondary | ICD-10-CM | POA: Diagnosis not present

## 2016-12-27 ENCOUNTER — Other Ambulatory Visit: Payer: Self-pay | Admitting: Internal Medicine

## 2016-12-27 DIAGNOSIS — H33311 Horseshoe tear of retina without detachment, right eye: Secondary | ICD-10-CM | POA: Diagnosis not present

## 2016-12-27 DIAGNOSIS — H43813 Vitreous degeneration, bilateral: Secondary | ICD-10-CM | POA: Diagnosis not present

## 2016-12-27 DIAGNOSIS — H35371 Puckering of macula, right eye: Secondary | ICD-10-CM | POA: Diagnosis not present

## 2016-12-27 DIAGNOSIS — E041 Nontoxic single thyroid nodule: Secondary | ICD-10-CM

## 2016-12-27 DIAGNOSIS — H35341 Macular cyst, hole, or pseudohole, right eye: Secondary | ICD-10-CM | POA: Diagnosis not present

## 2017-01-01 ENCOUNTER — Telehealth: Payer: Self-pay | Admitting: *Deleted

## 2017-01-01 NOTE — Telephone Encounter (Signed)
Can you find out if they can do the biopsy with the patient on aspirin?  If so, then okay to stop plavix and instruct patient to take aspirin daily.  Ideally with her history of stroke, I would want her on some antiplatelet therapy, if able.    If they are unable to do the biopsy on aspirin, okay to stop both aspirin and plavix and restart when safe from their standpoint.  Andrea Landry K. Posey Pronto, DO

## 2017-01-01 NOTE — Telephone Encounter (Signed)
Left message for Arena to call me back.

## 2017-01-01 NOTE — Telephone Encounter (Signed)
Arena from [facility] called requesting for patient to be able to stop her Plavix 5 days prior to her thyroid biopsy.  Please advise.

## 2017-01-02 NOTE — Telephone Encounter (Signed)
I talked to [facility] and she said that it is ok for patient to have aspirin.  She will contact patient to give her instructions.

## 2017-01-03 DIAGNOSIS — H2513 Age-related nuclear cataract, bilateral: Secondary | ICD-10-CM | POA: Diagnosis not present

## 2017-01-03 DIAGNOSIS — H33311 Horseshoe tear of retina without detachment, right eye: Secondary | ICD-10-CM | POA: Diagnosis not present

## 2017-01-03 DIAGNOSIS — H2512 Age-related nuclear cataract, left eye: Secondary | ICD-10-CM | POA: Diagnosis not present

## 2017-01-07 DIAGNOSIS — H33311 Horseshoe tear of retina without detachment, right eye: Secondary | ICD-10-CM | POA: Diagnosis not present

## 2017-01-09 ENCOUNTER — Ambulatory Visit
Admission: RE | Admit: 2017-01-09 | Discharge: 2017-01-09 | Disposition: A | Discharge status: Home / Self Care | Payer: Medicare HMO | Source: Ambulatory Visit | Attending: Internal Medicine | Admitting: Internal Medicine

## 2017-01-09 ENCOUNTER — Other Ambulatory Visit (HOSPITAL_COMMUNITY)
Admission: RE | Admit: 2017-01-09 | Discharge: 2017-01-09 | Disposition: A | Discharge status: Home / Self Care | Payer: Medicare HMO | Source: Ambulatory Visit | Attending: Diagnostic Radiology | Admitting: Diagnostic Radiology

## 2017-01-09 DIAGNOSIS — E041 Nontoxic single thyroid nodule: Secondary | ICD-10-CM | POA: Insufficient documentation

## 2017-01-14 ENCOUNTER — Encounter: Payer: Self-pay | Admitting: Neurology

## 2017-01-14 ENCOUNTER — Ambulatory Visit (INDEPENDENT_AMBULATORY_CARE_PROVIDER_SITE_OTHER): Payer: Medicare HMO | Admitting: Neurology

## 2017-01-14 VITALS — BP 110/70 | HR 64 | Ht 67.0 in | Wt 154.4 lb

## 2017-01-14 DIAGNOSIS — G458 Other transient cerebral ischemic attacks and related syndromes: Secondary | ICD-10-CM | POA: Diagnosis not present

## 2017-01-14 NOTE — Patient Instructions (Signed)
Discontinue aspirin.  Continue plavix and atorvastatin.  Return to clinic in 6 months

## 2017-01-14 NOTE — Progress Notes (Signed)
Follow-up Visit   Date: 01/14/17    Andrea Landry MRN: [id number] DOB: 10-11-1940   Interim History: Andrea Landry is a 76 y.o. right-handed Caucasian female with GERD, hypertension, history of breast cancer s/p right mastectomy (1999) returning to the clinic for follow-up of TIA.  The patient was accompanied to the clinic by self.  History of present illness: Initial visit 08/16/2016:  She was at church on March 25th and while talking, she developed numbness over the right corner of her lips and 4th and 5th digit on the right. At the same time, she was unable to express words and unable to spell when typing. She was still able to comprehend what was being said.  Symptoms lasted 30-minutes and completely resolved, but then would recur in the same day.  She was having ongoing symptoms almost daily until April 8th, these stereotyped spells could occur 5-10 times per day, lasting anywhere from minutes to several hours.  She described her speech difficulty as being "confused", but upon further questioning, denies disorientation, but moreso describes that she did not know what was happening to her during these events.There was no loss of consciousness or fatigue. On March 28th, she developed right cheek twitching which was constant for 3-days.  She went to the ER on 3/28 for these symptoms where MRI brain was ordered which did not show anything worrisome, specifically no sign of acute stroke or mass, there is scattered white matter changes. Of note, her CBC showed elevated platelet count of 647.  She followed up with her PCP who ordered US carotids which was normal and had repeat CBC with platelets 584.  I am not sure what her historical trend of platelets has been.  She was also started on aspirin 81mg  daily.   Currently, she denies any new symptoms and has been symptom-free for the past 5-days.  No headache, changes in vision, or limb weakness.  She had had two UTIs in the past 9-months, but  reports being asymptomatic from this and is concerned that her presentation may be due to another UTI.  UPDATE 10/22/2016:  She is here for follow-up visit and reports doing well without any new neurological symptoms or speech changes or facial paresthesias. She is compliant with plavix and aspirin which she takes for intracranial stenosis (left distal M1 stenosis).  Her atorvastatin was reduced to 20mg  daily due to myalgias.     UPDATE 01/14/2017:   She is here for follow-up visit and denies any new neurological complaints. She is doing well on atorvastatin 30mg  and denies any myalgias.  Her US of the thyroid mass showed benign nodule.    Medications:  Current Outpatient Prescriptions on File Prior to Visit  Medication Sig Dispense Refill  . atorvastatin (LIPITOR) 20 MG tablet Take 1 tablet (20 mg total) by mouth daily. (Patient taking differently: Take 30 mg by mouth daily. ) 90 tablet 3  . cholecalciferol (VITAMIN D) 1000 units tablet Take 1 tablet by mouth daily    . clonazePAM (KLONOPIN) 0.5 MG tablet Take 1 tablet by mouth daily as needed for anxiety or sleep    . clopidogrel (PLAVIX) 75 MG tablet Take 1 tablet (75 mg total) by mouth daily. 30 tablet 5  . diphenhydrAMINE (BENADRYL) 25 MG tablet Take 25 mg by mouth every 6 (six) hours as needed.    Marland Kitchen esomeprazole (NEXIUM 24HR) 20 MG capsule Take 1 capsule by mouth daily    . metoprolol tartrate (LOPRESSOR) 25 MG  tablet Take 25 mg by mouth daily.     . Multiple Vitamins-Minerals (MULTIVITAMIN ADULT) TABS Take 1 tablet by mouth daily    . triamterene-hydrochlorothiazide (MAXZIDE-25) 37.5-25 MG tablet Take 0.5 tablets by mouth daily.     Marland Kitchen amLODipine (NORVASC) 5 MG tablet   6   No current facility-administered medications on file prior to visit.     Allergies:  Allergies  Allergen Reactions  . Lisinopril     Review of Systems:  CONSTITUTIONAL: No fevers, chills, night sweats, or weight loss.  EYES: No visual changes or eye pain ENT:  No hearing changes.  No history of nose bleeds.   RESPIRATORY: No cough, wheezing and shortness of breath.   CARDIOVASCULAR: Negative for chest pain, and palpitations.   GI: Negative for abdominal discomfort, blood in stools or black stools.  No recent change in bowel habits.   GU:  No history of incontinence.   MUSCLOSKELETAL: No history of joint pain or swelling.  No myalgias.   SKIN: Negative for lesions, rash, and itching.   ENDOCRINE: Negative for cold or heat intolerance, polydipsia or goiter.   PSYCH:  No depression or anxiety symptoms.   NEURO: As Above.   Vital Signs:  BP 110/70   Pulse 64   Ht 5\' 7"  (1.702 m)   Wt 154 lb 7 oz (70.1 kg)   LMP 01/11/1996   SpO2 94%   BMI 24.19 kg/m   General:  Well appearing, comfortable  Neurological Exam: MENTAL STATUS including orientation to time, place, person, recent and remote memory, attention span and concentration, language, and fund of knowledge is normal.  Speech is not dysarthric.  CRANIAL NERVES: Pupils equal round and reactive to light.  Normal conjugate, extra-ocular eye movements in all directions of gaze.  No ptosis.  Normal facial sensation.  Face is symmetric. Palate elevates symmetrically.  Tongue is midline.  MOTOR:  Motor strength is 5/5 in all extremities.  No pronator drift.  Tone is normal.    SENSORY:  Intact to light touch.  COORDINATION/GAIT:  Normal finger-to- nose-finger.  Intact rapid alternating movements bilaterally.  Gait narrow based and stable.    Data: US carotids 08/13/2016:  No significant stenosis  MRI brain wo contrast 07/31/2016:  Mild to moderate chronic microvascular ischemic changes in the white matter. No acute intracranial abnormality.  Mild mucosal edema in the paranasal sinuses.  Routine EEG 08/16/2016: normal  CTA head 09/10/2016:  1. Critical distal left M1 segment stenosis. 2. Atheromatous type irregularity of left M2 branches. Elsewhere, intracranial vessels are unremarkable. 3. 4  cm thoracic inlet mass on a single slice through the chest for contrast timing. This is likely goiter or thyroid nodule, recommend sonography for confirmation.  MRA head 08/30/2016:   1. Abrupt signal loss of the distal M1 segment of the left MCA is favored to be artifactual, as the distal branches are normal. However, a high-grade stenosis would be difficult to exclude. CTA of the brain should be considered for definitive characterization. 2. Otherwise normal intracranial MRA.  IMPRESSION/PLAN: TIA manifesting with expressive aphasia, R arm and face paresthesias (07/2016) due to critical distal M1 stenosis  - She was started on medical management with dual antiplatelet therapy and statin.   - Continue monotherapy with plavix 75mg  daily. Discontinue aspirin  - Continue atorvastatin 30mg  daily (LDL 99)  - Blood pressure is well-controlled off medication  - She was counseled on stroke warning signs  - Low threshold for cerebral angiogram, if she develops  any new symptoms  - Encouraged her to stay active   Return to clinic in 6 months   Thank you for allowing me to participate in patient's care.  If I can answer any additional questions, I would be pleased to do so.    Sincerely,    Donika K. Posey Pronto, DO

## 2017-01-23 DIAGNOSIS — H2512 Age-related nuclear cataract, left eye: Secondary | ICD-10-CM | POA: Diagnosis not present

## 2017-01-24 DIAGNOSIS — H2511 Age-related nuclear cataract, right eye: Secondary | ICD-10-CM | POA: Diagnosis not present

## 2017-02-13 DIAGNOSIS — H2511 Age-related nuclear cataract, right eye: Secondary | ICD-10-CM | POA: Diagnosis not present

## 2017-03-13 ENCOUNTER — Other Ambulatory Visit: Payer: Self-pay | Admitting: Neurology

## 2017-04-08 DIAGNOSIS — H43813 Vitreous degeneration, bilateral: Secondary | ICD-10-CM | POA: Diagnosis not present

## 2017-04-08 DIAGNOSIS — H35371 Puckering of macula, right eye: Secondary | ICD-10-CM | POA: Diagnosis not present

## 2017-04-08 DIAGNOSIS — H35341 Macular cyst, hole, or pseudohole, right eye: Secondary | ICD-10-CM | POA: Diagnosis not present

## 2017-04-25 DIAGNOSIS — Z853 Personal history of malignant neoplasm of breast: Secondary | ICD-10-CM | POA: Diagnosis not present

## 2017-04-25 DIAGNOSIS — Z1231 Encounter for screening mammogram for malignant neoplasm of breast: Secondary | ICD-10-CM | POA: Diagnosis not present

## 2017-05-29 DIAGNOSIS — I10 Essential (primary) hypertension: Secondary | ICD-10-CM | POA: Diagnosis not present

## 2017-05-29 DIAGNOSIS — M179 Osteoarthritis of knee, unspecified: Secondary | ICD-10-CM | POA: Diagnosis not present

## 2017-05-29 DIAGNOSIS — E785 Hyperlipidemia, unspecified: Secondary | ICD-10-CM | POA: Diagnosis not present

## 2017-06-25 DIAGNOSIS — I10 Essential (primary) hypertension: Secondary | ICD-10-CM | POA: Diagnosis not present

## 2017-06-25 DIAGNOSIS — Z79899 Other long term (current) drug therapy: Secondary | ICD-10-CM | POA: Diagnosis not present

## 2017-06-25 DIAGNOSIS — M8580 Other specified disorders of bone density and structure, unspecified site: Secondary | ICD-10-CM | POA: Diagnosis not present

## 2017-06-25 DIAGNOSIS — E559 Vitamin D deficiency, unspecified: Secondary | ICD-10-CM | POA: Diagnosis not present

## 2017-06-25 DIAGNOSIS — K219 Gastro-esophageal reflux disease without esophagitis: Secondary | ICD-10-CM | POA: Diagnosis not present

## 2017-06-25 DIAGNOSIS — I679 Cerebrovascular disease, unspecified: Secondary | ICD-10-CM | POA: Diagnosis not present

## 2017-06-25 DIAGNOSIS — E785 Hyperlipidemia, unspecified: Secondary | ICD-10-CM | POA: Diagnosis not present

## 2017-06-25 DIAGNOSIS — G4700 Insomnia, unspecified: Secondary | ICD-10-CM | POA: Diagnosis not present

## 2017-08-04 DIAGNOSIS — M1990 Unspecified osteoarthritis, unspecified site: Secondary | ICD-10-CM | POA: Diagnosis not present

## 2017-08-04 DIAGNOSIS — E785 Hyperlipidemia, unspecified: Secondary | ICD-10-CM | POA: Diagnosis not present

## 2017-08-04 DIAGNOSIS — I10 Essential (primary) hypertension: Secondary | ICD-10-CM | POA: Diagnosis not present

## 2017-08-04 DIAGNOSIS — M179 Osteoarthritis of knee, unspecified: Secondary | ICD-10-CM | POA: Diagnosis not present

## 2017-08-11 ENCOUNTER — Ambulatory Visit: Payer: Medicare HMO | Admitting: Neurology

## 2017-08-11 ENCOUNTER — Encounter: Payer: Self-pay | Admitting: Neurology

## 2017-08-11 VITALS — BP 108/64 | HR 72 | Ht 67.0 in | Wt 162.2 lb

## 2017-08-11 DIAGNOSIS — G458 Other transient cerebral ischemic attacks and related syndromes: Secondary | ICD-10-CM

## 2017-08-11 MED ORDER — CLOPIDOGREL BISULFATE 75 MG PO TABS: 75.0000 mg | ORAL_TABLET | Freq: Every day | ORAL | 3 refills | Status: DC

## 2017-08-11 NOTE — Progress Notes (Signed)
Follow-up Visit   Date: 08/11/17    Andrea Landry MRN: [id number] DOB: Mar 28, 1941   Interim History: Andrea Landry is a 77 y.o. right-handed Caucasian female with GERD, hypertension, history of breast cancer s/p right mastectomy (1999) returning to the clinic for follow-up of TIA.  The patient was accompanied to the clinic by self.  History of present illness: Initial visit 08/16/2016:  She was at church on March 25th and while talking, she developed numbness over the right corner of her lips and 4th and 5th digit on the right. At the same time, she was unable to express words and unable to spell when typing. She was still able to comprehend what was being said.  Symptoms lasted 30-minutes and completely resolved, but then would recur in the same day.  She was having ongoing symptoms almost daily until April 8th, these stereotyped spells could occur 5-10 times per day, lasting anywhere from minutes to several hours.  She described her speech difficulty as being "confused", but upon further questioning, denies disorientation, but moreso describes that she did not know what was happening to her during these events.There was no loss of consciousness or fatigue. On March 28th, she developed right cheek twitching which was constant for 3-days.  She went to the ER on 3/28 for these symptoms where MRI brain was ordered which did not show anything worrisome, specifically no sign of acute stroke or mass, there is scattered white matter changes. Of note, her CBC showed elevated platelet count of 647.  She followed up with her PCP who ordered US carotids which was normal and had repeat CBC with platelets 584.  I am not sure what her historical trend of platelets has been.  She was also started on aspirin 81mg  daily.   She did not have any new neurological symptoms in 2018.  She is compliant with plavix and aspirin which she takes for intracranial stenosis (left distal M1 stenosis).    UPDATE  08/11/2017:  She is here for follow-up visit and has been doing well.  No new speech changes or facial tingling.  She has intermittent spells of burning sensation over the right upper thigh region, which is very sporadic.  No leg weakness or back pain.  She is complaint with plavix 75mg  and lipitor 30mg .  She has questions regarding [facility] letter stating that she cannot donate blood due to problems with filtering her whole blood.    Medications:  Current Outpatient Medications on File Prior to Visit  Medication Sig Dispense Refill  . amLODipine (NORVASC) 5 MG tablet   6  . atorvastatin (LIPITOR) 20 MG tablet Take 1 tablet (20 mg total) by mouth daily. (Patient taking differently: Take 30 mg by mouth daily. ) 90 tablet 3  . cholecalciferol (VITAMIN D) 1000 units tablet Take 1 tablet by mouth daily    . clonazePAM (KLONOPIN) 0.5 MG tablet Take 1 tablet by mouth daily as needed for anxiety or sleep    . diphenhydrAMINE (BENADRYL) 25 MG tablet Take 25 mg by mouth every 6 (six) hours as needed.    . metoprolol tartrate (LOPRESSOR) 25 MG tablet Take 25 mg by mouth daily.     . Multiple Vitamins-Minerals (MULTIVITAMIN ADULT) TABS Take 1 tablet by mouth daily    . pantoprazole (PROTONIX) 40 MG tablet   11  . triamterene-hydrochlorothiazide (MAXZIDE-25) 37.5-25 MG tablet Take 0.5 tablets by mouth daily.      No current facility-administered medications on file prior to  visit.     Allergies:  Allergies  Allergen Reactions  . Lisinopril     Review of Systems:  CONSTITUTIONAL: No fevers, chills, night sweats, or weight loss.  EYES: No visual changes or eye pain ENT: No hearing changes.  No history of nose bleeds.   RESPIRATORY: No cough, wheezing and shortness of breath.   CARDIOVASCULAR: Negative for chest pain, and palpitations.   GI: Negative for abdominal discomfort, blood in stools or black stools.  No recent change in bowel habits.   GU:  No history of incontinence.   MUSCLOSKELETAL: No history  of joint pain or swelling.  No myalgias.   SKIN: Negative for lesions, rash, and itching.   ENDOCRINE: Negative for cold or heat intolerance, polydipsia or goiter.   PSYCH:  No depression or anxiety symptoms.   NEURO: As Above.   Vital Signs:  BP 108/64   Pulse 72   Ht 5\' 7"  (1.702 m)   Wt 162 lb 4 oz (73.6 kg)   LMP 01/11/1996   SpO2 94%   BMI 25.41 kg/m   General Medical Exam:   General:  Well appearing, comfortable  Neck:   No carotid bruits. Respiratory:  Clear to auscultation, good air entry bilaterally.   Cardiac:  Regular rate and rhythm, no murmur.   Ext: no edema  Neurological Exam: MENTAL STATUS including orientation to time, place, person, recent and remote memory, attention span and concentration, language, and fund of knowledge is normal.  Speech is not dysarthric.  CRANIAL NERVES: Pupils equal round and reactive to light.  Normal conjugate, extra-ocular eye movements in all directions of gaze.  No ptosis.  Normal facial sensation.  Face is symmetric. Palate elevates symmetrically.  Tongue is midline.  MOTOR:  Motor strength is 5/5 in all extremities.  No pronator drift.   SENSORY:  Intact to light touch.  COORDINATION/GAIT:  Normal finger-to- nose-finger.  Intact rapid alternating movements bilaterally.  Gait narrow based and stable.    Data: US carotids 08/13/2016:  No significant stenosis  MRI brain wo contrast 07/31/2016:  Mild to moderate chronic microvascular ischemic changes in the white matter. No acute intracranial abnormality.  Mild mucosal edema in the paranasal sinuses.  Routine EEG 08/16/2016: normal  CTA head 09/10/2016:  1. Critical distal left M1 segment stenosis. 2. Atheromatous type irregularity of left M2 branches. Elsewhere, intracranial vessels are unremarkable. 3. 4 cm thoracic inlet mass on a single slice through the chest for contrast timing. This is likely goiter or thyroid nodule, recommend sonography for confirmation.  MRA head  08/30/2016:   1. Abrupt signal loss of the distal M1 segment of the left MCA is favored to be artifactual, as the distal branches are normal. However, a high-grade stenosis would be difficult to exclude. CTA of the brain should be considered for definitive characterization. 2. Otherwise normal intracranial MRA.  IMPRESSION/PLAN: TIA manifesting with expressive aphasia, R arm and face paresthesias (07/2016) due to critical distal M1 stenosis, clinically doing well with no new neurological events over the past year  - She was started on medical management with dual antiplatelet therapy and statin x 6 months, then started on monotherapy  - Continue plavix 75mg  daily - refills provided for 1 year  - Continue atorvastatin 30mg  daily.  Request last fasting lipid profile from Dr. Inda Merlin' office  - Blood pressure is well-controlled off medication  - Low threshold for cerebral angiogram, if she develops any new symptoms  - Encouraged her to stay active  Right thigh paresthesias ?meralgia paresthetica.  Symptoms are intermittent.  OK to use OTC lidocaine ointment for painful paresthesias  I am not sure why she is unable to donate blood, doubt that it is due to being on plavix as this would cause platelet dysfunction. She will be seeing Dr. Inda Merlin later this week for his opinion.   Return to clinic in 9 months    Thank you for allowing me to participate in patient's care.  If I can answer any additional questions, I would be pleased to do so.    Sincerely,    Donika K. Posey Pronto, DO

## 2017-08-11 NOTE — Patient Instructions (Signed)
It was great seeing you today.  Stay on your current medications  You can try over the counter lidocaine ointment (Aspercream, Salonpas)  Return to clinic 9 months

## 2017-08-12 MED ORDER — ATORVASTATIN CALCIUM 20 MG PO TABS: 20.0000 mg | ORAL_TABLET | Freq: Every day | ORAL | 3 refills | Status: DC

## 2017-08-12 NOTE — Addendum Note (Signed)
Addended by: Alda Berthold on: 08/12/2017 04:29 PM   Modules accepted: Orders

## 2017-08-12 NOTE — Progress Notes (Signed)
Addendum  Labs rec'd dated 06/25/2017:  Chol 136  TG 150  HDL 61  LDL 44  TSH 1.10  LDL has improved from 99 > 44 so would be reasonable to reduce lipitor to 20mg  daily.    Kathren Scearce K. Posey Pronto, DO

## 2017-08-13 NOTE — Progress Notes (Signed)
Patient given instructions and agreed with plan.  

## 2017-08-13 NOTE — Progress Notes (Signed)
LM for patient to call me back. 

## 2017-08-14 DIAGNOSIS — Z130 Encounter for screening for diseases of the blood and blood-forming organs and certain disorders involving the immune mechanism: Secondary | ICD-10-CM | POA: Diagnosis not present

## 2017-10-07 DIAGNOSIS — H35371 Puckering of macula, right eye: Secondary | ICD-10-CM | POA: Diagnosis not present

## 2017-10-07 DIAGNOSIS — H43813 Vitreous degeneration, bilateral: Secondary | ICD-10-CM | POA: Diagnosis not present

## 2017-10-07 DIAGNOSIS — H35341 Macular cyst, hole, or pseudohole, right eye: Secondary | ICD-10-CM | POA: Diagnosis not present

## 2017-11-13 DIAGNOSIS — R509 Fever, unspecified: Secondary | ICD-10-CM | POA: Diagnosis not present

## 2017-11-13 DIAGNOSIS — H66001 Acute suppurative otitis media without spontaneous rupture of ear drum, right ear: Secondary | ICD-10-CM | POA: Diagnosis not present

## 2017-11-28 DIAGNOSIS — I10 Essential (primary) hypertension: Secondary | ICD-10-CM | POA: Diagnosis not present

## 2017-11-28 DIAGNOSIS — E785 Hyperlipidemia, unspecified: Secondary | ICD-10-CM | POA: Diagnosis not present

## 2017-11-28 DIAGNOSIS — M179 Osteoarthritis of knee, unspecified: Secondary | ICD-10-CM | POA: Diagnosis not present

## 2018-01-07 ENCOUNTER — Encounter: Payer: Self-pay | Admitting: Neurology

## 2018-02-09 ENCOUNTER — Ambulatory Visit
Admission: RE | Admit: 2018-02-09 | Discharge: 2018-02-09 | Disposition: A | Discharge status: Home / Self Care | Payer: Medicare HMO | Source: Ambulatory Visit | Attending: Internal Medicine | Admitting: Internal Medicine

## 2018-02-09 ENCOUNTER — Other Ambulatory Visit: Payer: Self-pay | Admitting: Internal Medicine

## 2018-02-09 DIAGNOSIS — R0609 Other forms of dyspnea: Secondary | ICD-10-CM | POA: Diagnosis not present

## 2018-02-09 DIAGNOSIS — R0602 Shortness of breath: Secondary | ICD-10-CM | POA: Diagnosis not present

## 2018-02-17 ENCOUNTER — Other Ambulatory Visit (HOSPITAL_COMMUNITY): Payer: Self-pay | Admitting: Respiratory Therapy

## 2018-02-17 DIAGNOSIS — R0609 Other forms of dyspnea: Secondary | ICD-10-CM

## 2018-02-24 ENCOUNTER — Ambulatory Visit (HOSPITAL_COMMUNITY)
Admission: RE | Admit: 2018-02-24 | Discharge: 2018-02-24 | Disposition: A | Discharge status: Home / Self Care | Payer: Medicare HMO | Source: Ambulatory Visit | Attending: Internal Medicine | Admitting: Internal Medicine

## 2018-02-24 DIAGNOSIS — R0609 Other forms of dyspnea: Secondary | ICD-10-CM | POA: Diagnosis not present

## 2018-02-24 LAB — PULMONARY FUNCTION TEST
DL/VA % PRED: 80 %
DL/VA: 4.18 ml/min/mmHg/L
DLCO UNC % PRED: 64 %
DLCO unc: 18.71 ml/min/mmHg
FEF 25-75 POST: 2.32 L/s
FEF 25-75 Pre: 1.93 L/sec
FEF2575-%Change-Post: 20 %
FEF2575-%PRED-PRE: 106 %
FEF2575-%Pred-Post: 127 %
FEV1-%Change-Post: 4 %
FEV1-%PRED-POST: 91 %
FEV1-%Pred-Pre: 87 %
FEV1-Post: 2.2 L
FEV1-Pre: 2.12 L
FEV1FVC-%CHANGE-POST: 3 %
FEV1FVC-%Pred-Pre: 103 %
FEV6-%CHANGE-POST: 0 %
FEV6-%PRED-PRE: 89 %
FEV6-%Pred-Post: 89 %
FEV6-POST: 2.75 L
FEV6-Pre: 2.74 L
FEV6FVC-%PRED-PRE: 104 %
FEV6FVC-%Pred-Post: 104 %
FVC-%CHANGE-POST: 0 %
FVC-%PRED-POST: 85 %
FVC-%Pred-Pre: 85 %
FVC-POST: 2.75 L
FVC-Pre: 2.74 L
POST FEV6/FVC RATIO: 100 %
PRE FEV1/FVC RATIO: 77 %
Post FEV1/FVC ratio: 80 %
Pre FEV6/FVC Ratio: 100 %
RV % pred: 112 %
RV: 2.79 L
TLC % pred: 103 %
TLC: 5.75 L

## 2018-02-24 MED ORDER — ALBUTEROL SULFATE (2.5 MG/3ML) 0.083% IN NEBU
2.5000 mg | INHALATION_SOLUTION | Freq: Once | RESPIRATORY_TRACT | Status: AC
  Administered 2018-02-24: 2.5 mg via RESPIRATORY_TRACT

## 2018-02-27 DIAGNOSIS — R0609 Other forms of dyspnea: Secondary | ICD-10-CM | POA: Diagnosis not present

## 2018-03-02 DIAGNOSIS — I10 Essential (primary) hypertension: Secondary | ICD-10-CM | POA: Diagnosis not present

## 2018-03-02 DIAGNOSIS — R0609 Other forms of dyspnea: Secondary | ICD-10-CM | POA: Diagnosis not present

## 2018-03-02 DIAGNOSIS — E785 Hyperlipidemia, unspecified: Secondary | ICD-10-CM | POA: Diagnosis not present

## 2018-03-02 DIAGNOSIS — J449 Chronic obstructive pulmonary disease, unspecified: Secondary | ICD-10-CM | POA: Diagnosis not present

## 2018-03-02 DIAGNOSIS — R0602 Shortness of breath: Secondary | ICD-10-CM | POA: Diagnosis not present

## 2018-03-02 DIAGNOSIS — M179 Osteoarthritis of knee, unspecified: Secondary | ICD-10-CM | POA: Diagnosis not present

## 2018-03-02 DIAGNOSIS — M1990 Unspecified osteoarthritis, unspecified site: Secondary | ICD-10-CM | POA: Diagnosis not present

## 2018-03-06 ENCOUNTER — Other Ambulatory Visit: Payer: Self-pay | Admitting: Cardiology

## 2018-03-06 DIAGNOSIS — R0609 Other forms of dyspnea: Secondary | ICD-10-CM

## 2018-03-19 ENCOUNTER — Encounter (HOSPITAL_COMMUNITY)
Admission: RE | Admit: 2018-03-19 | Discharge: 2018-03-19 | Disposition: A | Discharge status: Home / Self Care | Payer: Medicare HMO | Source: Ambulatory Visit | Attending: Cardiology | Admitting: Cardiology

## 2018-03-19 DIAGNOSIS — R0789 Other chest pain: Secondary | ICD-10-CM | POA: Diagnosis not present

## 2018-03-19 DIAGNOSIS — R079 Chest pain, unspecified: Secondary | ICD-10-CM | POA: Diagnosis not present

## 2018-03-19 DIAGNOSIS — R0609 Other forms of dyspnea: Secondary | ICD-10-CM | POA: Insufficient documentation

## 2018-03-19 MED ORDER — REGADENOSON 0.4 MG/5ML IV SOLN
0.4000 mg | Freq: Once | INTRAVENOUS | Status: AC
Start: 2018-03-19 — End: 2018-03-19
  Administered 2018-03-19: 0.4 mg via INTRAVENOUS

## 2018-03-19 MED ORDER — TECHNETIUM TC 99M TETROFOSMIN IV KIT
10.0000 | PACK | Freq: Once | INTRAVENOUS | Status: AC | PRN
  Administered 2018-03-19: 10 via INTRAVENOUS

## 2018-03-19 MED ORDER — TECHNETIUM TC 99M TETROFOSMIN IV KIT
30.0000 | PACK | Freq: Once | INTRAVENOUS | Status: AC | PRN
  Administered 2018-03-19: 30 via INTRAVENOUS

## 2018-03-19 MED ORDER — REGADENOSON 0.4 MG/5ML IV SOLN
INTRAVENOUS | Status: AC
  Administered 2018-03-19: 0.4 mg via INTRAVENOUS
  Filled 2018-03-19: qty 5

## 2018-03-23 DIAGNOSIS — J984 Other disorders of lung: Secondary | ICD-10-CM | POA: Diagnosis not present

## 2018-03-27 ENCOUNTER — Telehealth: Payer: Self-pay | Admitting: Neurology

## 2018-03-27 NOTE — Telephone Encounter (Signed)
Patient Called and is needing to know if she can take a baby Aspirin? She said Dr. Inda Merlin recommended her take one? She wanted to ask Dr. Delice Lesch her thought on it? Please Call. Thanks

## 2018-03-30 NOTE — Telephone Encounter (Signed)
Did she say why Dr. Inda Merlin wants to add aspirin?  She is already taking plavix 75mg  daily for stroke prevention - please be sure that patient is taking it and Dr. Inda Merlin is aware.  Adding aspirin 81mg  to plavix 75mg  is okay, but can increase bleeding risk.  Need to be clear on indication for dual antiplatelet therapy.  Garnie Borchardt K. Posey Pronto, DO

## 2018-03-30 NOTE — Telephone Encounter (Signed)
Please advise 

## 2018-03-30 NOTE — Telephone Encounter (Signed)
Left message for patient to find out if Dr. Inda Merlin knows that she is already on plavix and let us know.  Informed her that it is ok per Dr. Posey Pronto but may increase bleeding risk.

## 2018-03-30 NOTE — Telephone Encounter (Signed)
Dr Patel patient.  

## 2018-03-30 NOTE — Telephone Encounter (Signed)
Dr. Delice Lesch patient.

## 2018-04-01 DIAGNOSIS — I10 Essential (primary) hypertension: Secondary | ICD-10-CM | POA: Diagnosis not present

## 2018-04-01 DIAGNOSIS — J449 Chronic obstructive pulmonary disease, unspecified: Secondary | ICD-10-CM | POA: Diagnosis not present

## 2018-04-01 DIAGNOSIS — M179 Osteoarthritis of knee, unspecified: Secondary | ICD-10-CM | POA: Diagnosis not present

## 2018-04-01 DIAGNOSIS — E785 Hyperlipidemia, unspecified: Secondary | ICD-10-CM | POA: Diagnosis not present

## 2018-04-01 DIAGNOSIS — M1990 Unspecified osteoarthritis, unspecified site: Secondary | ICD-10-CM | POA: Diagnosis not present

## 2018-04-06 ENCOUNTER — Telehealth: Payer: Self-pay | Admitting: Neurology

## 2018-04-06 NOTE — Telephone Encounter (Signed)
Patient said that she was calling [place] back. Please call her back at [phone]. Thanks!

## 2018-04-07 NOTE — Telephone Encounter (Signed)
I spoke with patient and she said that her doctor said not to start the baby aspirin.

## 2018-04-08 DIAGNOSIS — J449 Chronic obstructive pulmonary disease, unspecified: Secondary | ICD-10-CM | POA: Diagnosis not present

## 2018-04-08 DIAGNOSIS — R9439 Abnormal result of other cardiovascular function study: Secondary | ICD-10-CM | POA: Diagnosis not present

## 2018-04-08 DIAGNOSIS — E782 Mixed hyperlipidemia: Secondary | ICD-10-CM | POA: Diagnosis not present

## 2018-04-08 DIAGNOSIS — R0609 Other forms of dyspnea: Secondary | ICD-10-CM | POA: Diagnosis not present

## 2018-04-08 DIAGNOSIS — I10 Essential (primary) hypertension: Secondary | ICD-10-CM | POA: Diagnosis not present

## 2018-04-22 DIAGNOSIS — R0602 Shortness of breath: Secondary | ICD-10-CM | POA: Diagnosis not present

## 2018-05-04 DIAGNOSIS — Z853 Personal history of malignant neoplasm of breast: Secondary | ICD-10-CM | POA: Diagnosis not present

## 2018-05-04 DIAGNOSIS — Z1231 Encounter for screening mammogram for malignant neoplasm of breast: Secondary | ICD-10-CM | POA: Diagnosis not present

## 2018-05-06 IMAGING — US US ABDOMEN COMPLETE
1 series · 14 of 25 positions shown · non-contrast
Comparison: None.

CLINICAL DATA: Weight loss, chills, history of breast cancer

EXAM:
ABDOMEN ULTRASOUND COMPLETE

[Series 1: us abdomen complete · 0.28mm/px · 14 of 90 slices shown]
[im 1/90]
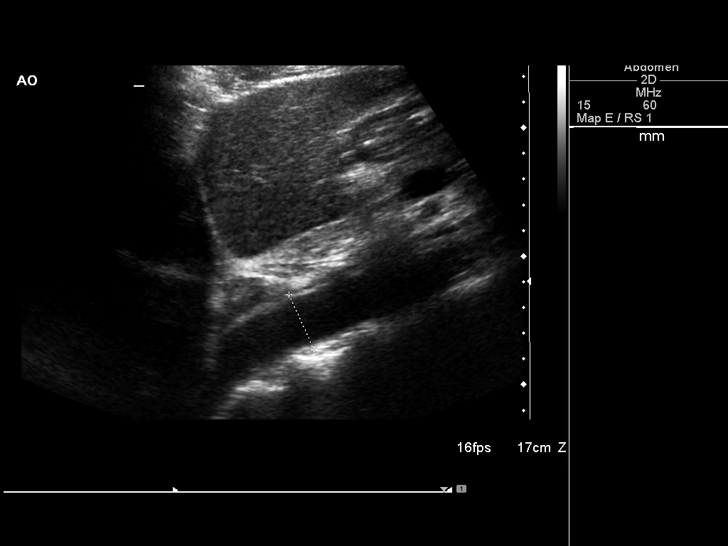
[im 8/90]
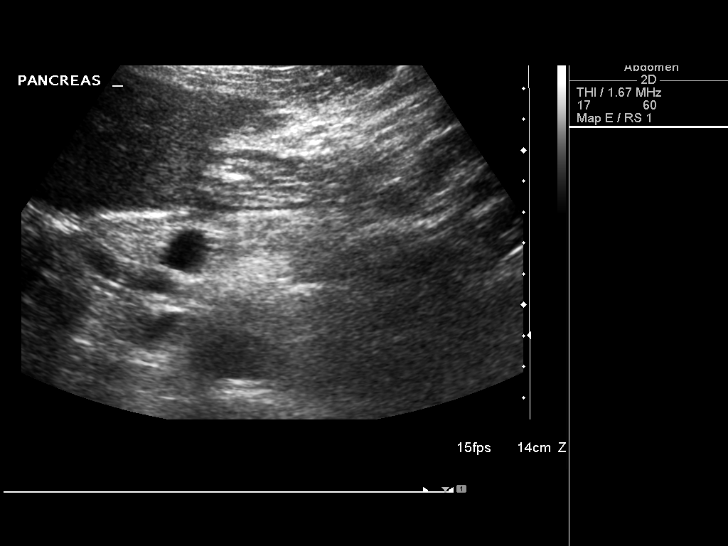
[im 15/90]
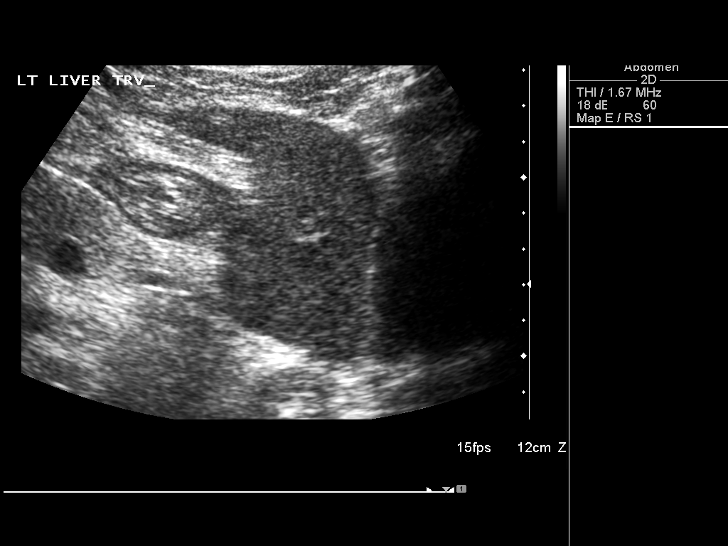
[im 23/90]
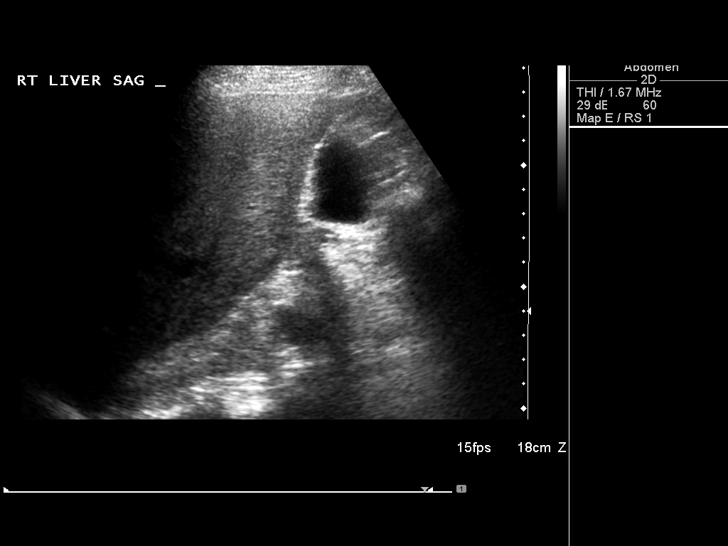
[im 30/90]
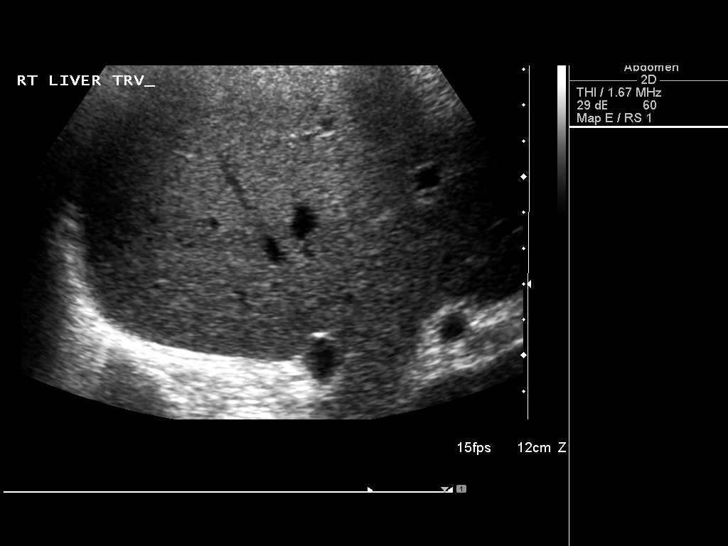
[im 34/90]
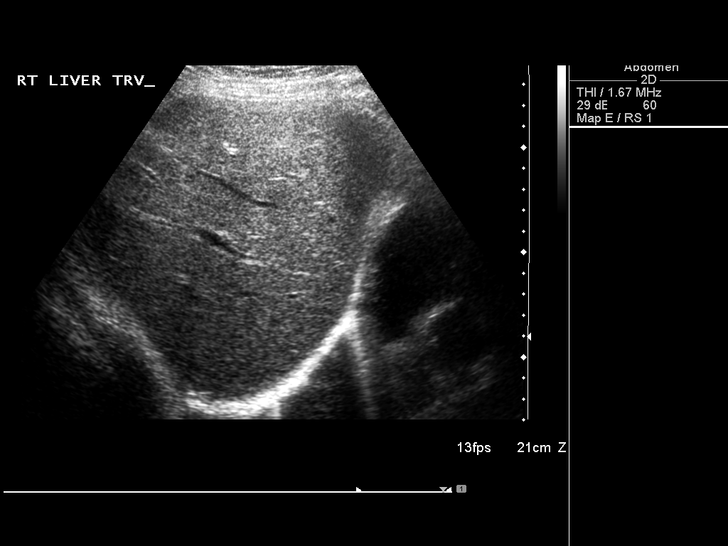
[im 41/90]
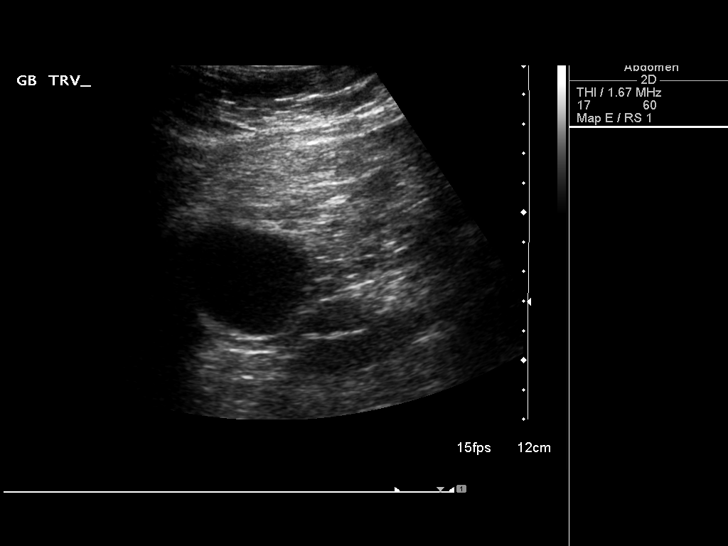
[im 49/90]
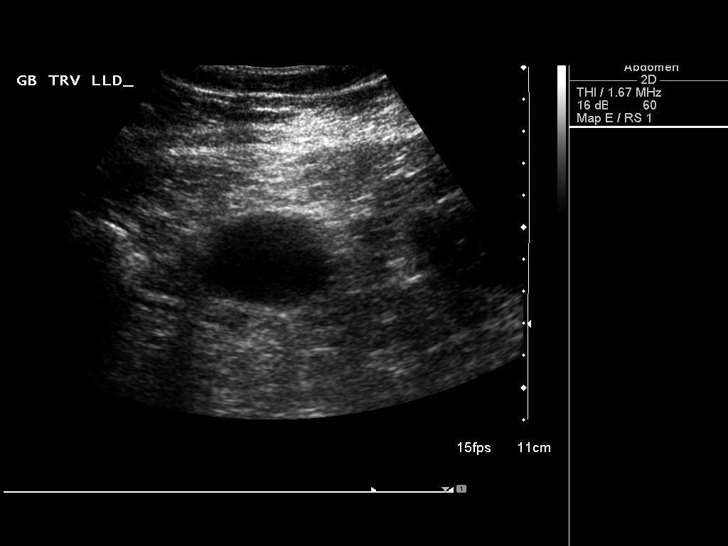
[im 56/90]
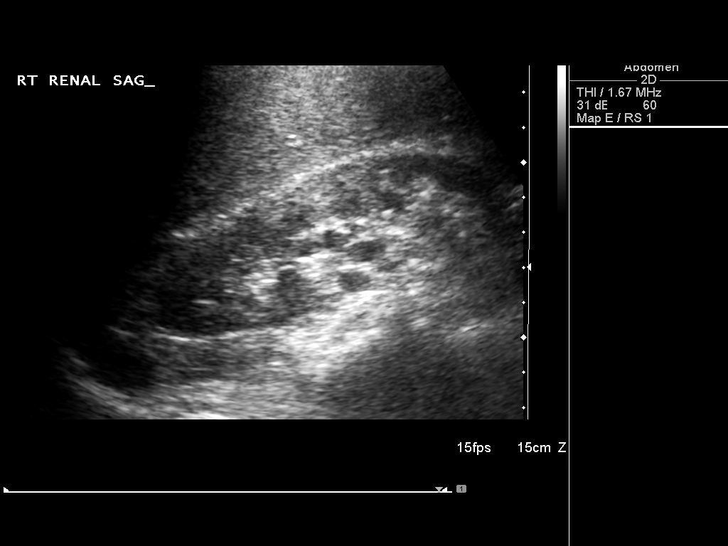
[im 60/90]
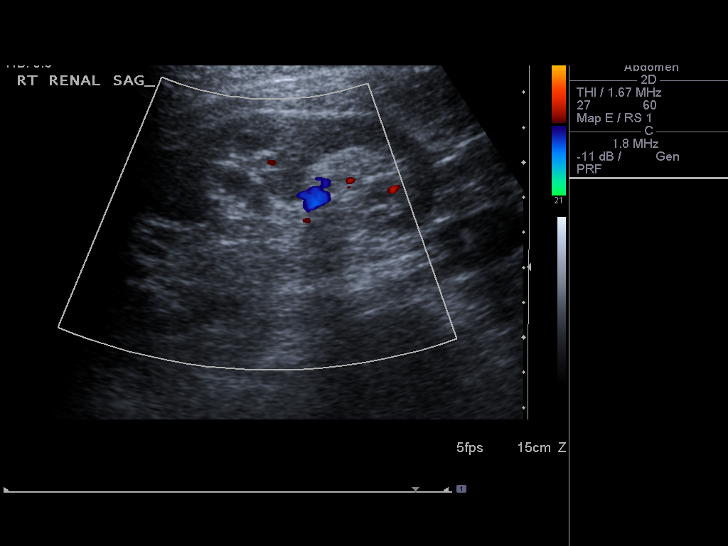
[im 67/90]
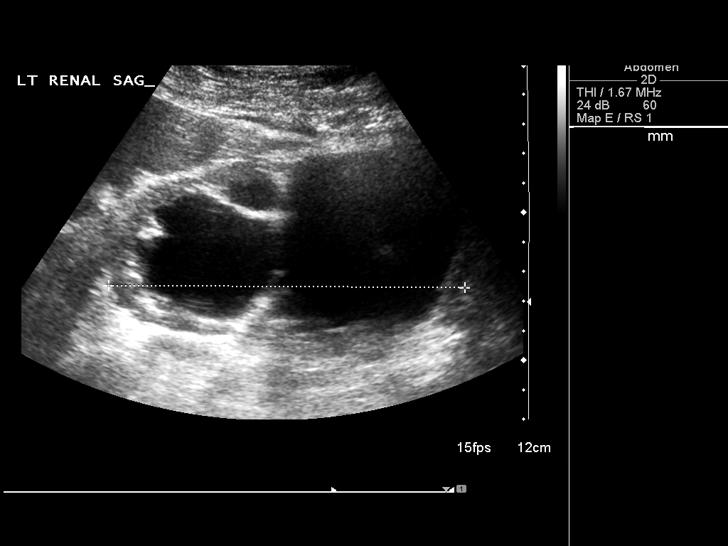
[im 75/90]
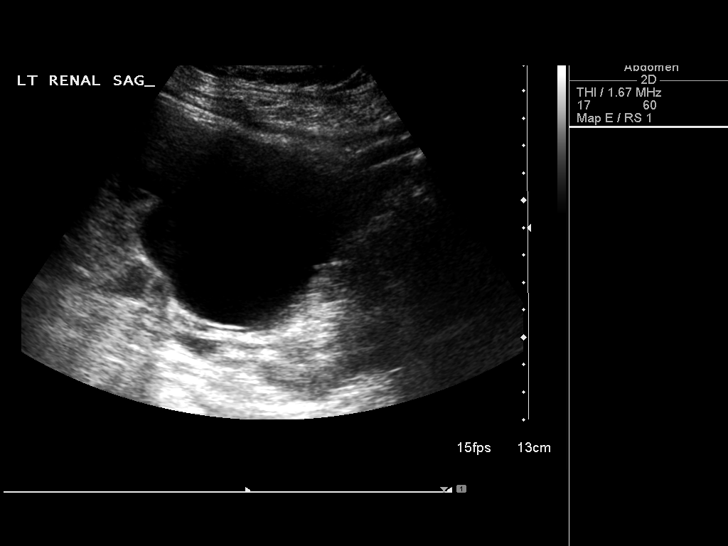
[im 82/90]
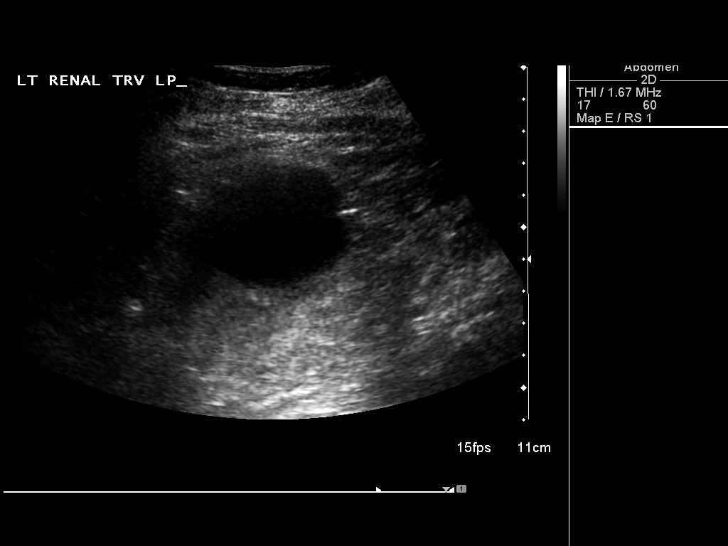
[im 90/90]
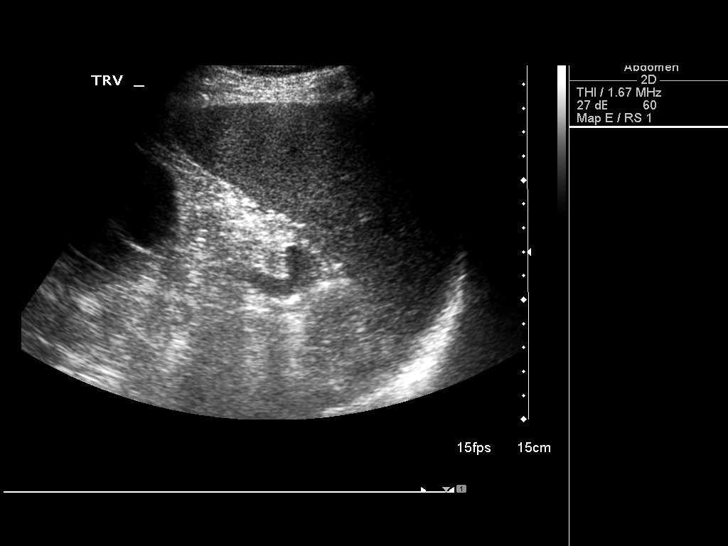

[14 of 25 positions shown; findings below may reference images not displayed]

FINDINGS: Gallbladder: No gallstones are noted within gallbladder. No
thickening of gallbladder wall. No sonographic Murphy's sign.

Common bile duct: Diameter: 3.8 mm in diameter within normal limits

Liver: No focal lesion identified. Within normal limits in
parenchymal echogenicity.

IVC: No abnormality visualized.

Pancreas: Visualized portion unremarkable.

Spleen: Size and appearance within normal limits. Measures 8.3 cm in
length.

Right Kidney: Length: 11.5 cm. Echogenicity within normal limits. No
mass or hydronephrosis visualized.

Left Kidney: Length: 12 cm. There is normal echogenicity. Bilateral
renal cortical thinning probable due to atrophy. Parapelvic cysts
are noted. The largest parapelvic cyst in midpole measures 9 x
cm. Second parapelvic cyst measures 4.5 x 3.8 cm.

Abdominal aorta: No aneurysm visualized. Measures up to 2.3 cm in
diameter.

Other findings: None.
IMPRESSION: 1. No gallstones are noted within gallbladder.  Normal CBD.
2. No hydronephrosis.
3. Left renal parapelvic cysts are noted.
4. No aortic aneurysm.

## 2018-05-08 DIAGNOSIS — H26491 Other secondary cataract, right eye: Secondary | ICD-10-CM | POA: Diagnosis not present

## 2018-05-08 DIAGNOSIS — H35341 Macular cyst, hole, or pseudohole, right eye: Secondary | ICD-10-CM | POA: Diagnosis not present

## 2018-05-08 DIAGNOSIS — Z961 Presence of intraocular lens: Secondary | ICD-10-CM | POA: Diagnosis not present

## 2018-05-18 ENCOUNTER — Ambulatory Visit: Payer: Medicare HMO | Admitting: Neurology

## 2018-05-18 ENCOUNTER — Encounter: Payer: Self-pay | Admitting: Neurology

## 2018-05-18 VITALS — BP 110/60 | HR 75 | Ht 67.5 in | Wt 165.0 lb

## 2018-05-18 DIAGNOSIS — R209 Unspecified disturbances of skin sensation: Secondary | ICD-10-CM

## 2018-05-18 DIAGNOSIS — R202 Paresthesia of skin: Secondary | ICD-10-CM

## 2018-05-18 DIAGNOSIS — G458 Other transient cerebral ischemic attacks and related syndromes: Secondary | ICD-10-CM

## 2018-05-18 NOTE — Patient Instructions (Signed)
Continue your medications as you are taking  If you have any new neurological concerns, please come back and see me  Return to clinic in 6 months

## 2018-05-18 NOTE — Progress Notes (Signed)
Follow-up Visit   Date: 05/18/18    Andrea Landry MRN: [id number] DOB: Mar 03, 1941   Interim History: Andrea Landry is a 78 y.o. right-handed Caucasian female with GERD, hypertension, history of breast cancer s/p right mastectomy (1999) returning to the clinic for follow-up of TIA.  The patient was accompanied to the clinic by self.  History of present illness: Initial visit 08/16/2016:  She was at church on March 25th and while talking, she developed numbness over the right corner of her lips and 4th and 5th digit on the right. At the same time, she was unable to express words and unable to spell when typing. She was still able to comprehend what was being said.  Symptoms lasted 30-minutes and completely resolved, but then would recur in the same day.  She was having ongoing symptoms almost daily until April 8th, these stereotyped spells could occur 5-10 times per day, lasting anywhere from minutes to several hours.  She described her speech difficulty as being "confused", but upon further questioning, denies disorientation, but moreso describes that she did not know what was happening to her during these events.There was no loss of consciousness or fatigue. On March 28th, she developed right cheek twitching which was constant for 3-days.  She went to the ER on 3/28 for these symptoms where MRI brain was ordered which did not show anything worrisome, specifically no sign of acute stroke or mass, there is scattered white matter changes. Of note, her CBC showed elevated platelet count of 647.  She followed up with her PCP who ordered US carotids which was normal and had repeat CBC with platelets 584.  I am not sure what her historical trend of platelets has been.  She was also started on aspirin 81mg  daily.   She did not have any new neurological symptoms in 2018.  She is compliant with plavix and aspirin which she takes for intracranial stenosis (left distal M1 stenosis).  She completed dual  antiplatelet therapy for 3 months, the continued plavix 75mg  daily.     UPDATE 05/18/2018:   She is here for follow-up visit and has been doing well.  She reports noticing very mild tingling of the right upper lip for the past month.  Symptoms are very mild, constant, and not bothersome.  She does not have any new speech changes or weakness.  She remains compliant with her medications and takes Plavix 75 mg and Lipitor 20 mg daily.  Right thigh tingling has resolved.   She remains highly independent and enjoys travelling.    Medications:  Current Outpatient Medications on File Prior to Visit  Medication Sig Dispense Refill  . amLODipine (NORVASC) 5 MG tablet   6  . atorvastatin (LIPITOR) 20 MG tablet Take 1 tablet (20 mg total) by mouth daily. 90 tablet 3  . cholecalciferol (VITAMIN D) 1000 units tablet Take 1 tablet by mouth daily    . clonazePAM (KLONOPIN) 0.5 MG tablet Take 1 tablet by mouth daily as needed for anxiety or sleep    . clopidogrel (PLAVIX) 75 MG tablet Take 1 tablet (75 mg total) by mouth daily. 90 tablet 3  . diphenhydrAMINE (BENADRYL) 25 MG tablet Take 25 mg by mouth every 6 (six) hours as needed.    . metoprolol tartrate (LOPRESSOR) 25 MG tablet Take 25 mg by mouth daily.     . Multiple Vitamins-Minerals (MULTIVITAMIN ADULT) TABS Take 1 tablet by mouth daily    . pantoprazole (PROTONIX) 40 MG tablet  11  . triamterene-hydrochlorothiazide (MAXZIDE-25) 37.5-25 MG tablet Take 0.5 tablets by mouth daily.      No current facility-administered medications on file prior to visit.     Allergies:  Allergies  Allergen Reactions  . Lisinopril Swelling    Review of Systems:  CONSTITUTIONAL: No fevers, chills, night sweats, or weight loss.  EYES: No visual changes or eye pain ENT: No hearing changes.  No history of nose bleeds.   RESPIRATORY: No cough, wheezing and shortness of breath.   CARDIOVASCULAR: Negative for chest pain, and palpitations.   GI: Negative for abdominal  discomfort, blood in stools or black stools.  No recent change in bowel habits.   GU:  No history of incontinence.   MUSCLOSKELETAL: No history of joint pain or swelling.  No myalgias.   SKIN: Negative for lesions, rash, and itching.   ENDOCRINE: Negative for cold or heat intolerance, polydipsia or goiter.   PSYCH:  No depression or anxiety symptoms.   NEURO: As Above.   Vital Signs:  BP 110/60   Pulse 75   Ht 5' 7.5" (1.715 m)   Wt 165 lb (74.8 kg)   LMP 01/11/1996   SpO2 95%   BMI 25.46 kg/m   General Medical Exam:   General:  Well appearing, comfortable  Eyes/ENT: see cranial nerve examination.   Neck: No masses appreciated.  Full range of motion without tenderness.  No carotid bruits. Respiratory:  Clear to auscultation, good air entry bilaterally.   Cardiac:  Regular rate and rhythm, no murmur.   Ext:  No edema  Neurological Exam: MENTAL STATUS including orientation to time, place, person, recent and remote memory, attention span and concentration, language, and fund of knowledge is normal.  Speech is not dysarthric.  CRANIAL NERVES: Pupils equal round and reactive to light.  Normal conjugate, extra-ocular eye movements in all directions of gaze.  No ptosis.  Normal facial sensation.  Face is symmetric. Palate elevates symmetrically.  Tongue is midline.  MOTOR:  Motor strength is 5/5 in all extremities.  No pronator drift.   SENSORY:  Intact to light touch.  COORDINATION/GAIT:  Normal finger-to- nose-finger.  Intact rapid alternating movements bilaterally.  Gait narrow based and stable.    Data: US carotids 08/13/2016:  No significant stenosis  MRI brain wo contrast 07/31/2016:  Mild to moderate chronic microvascular ischemic changes in the white matter. No acute intracranial abnormality.  Mild mucosal edema in the paranasal sinuses.  Routine EEG 08/16/2016: normal  CTA head 09/10/2016:  1. Critical distal left M1 segment stenosis. 2. Atheromatous type irregularity of  left M2 branches. Elsewhere, intracranial vessels are unremarkable. 3. 4 cm thoracic inlet mass on a single slice through the chest for contrast timing. This is likely goiter or thyroid nodule, recommend sonography for confirmation.  MRA head 08/30/2016:   1. Abrupt signal loss of the distal M1 segment of the left MCA is favored to be artifactual, as the distal branches are normal. However, a high-grade stenosis would be difficult to exclude. CTA of the brain should be considered for definitive characterization. 2. Otherwise normal intracranial MRA.  IMPRESSION/PLAN: TIA manifesting with expressive aphasia, R arm and face paresthesias (07/2016) due to critical distal M1 stenosis, clinically doing well with trace right lip tingling. She does not have any right facial weakness, sensory changes on exam, or limb paresthesias/weakness.   Exam is stable.  We discussed getting MRI brain to further evaluate her facial paresthesias, but she said symptoms are so mild and  did not want to pursue additional testing.   - Continue plavix 75mg  daily  - Continue atorvastatin 20mg  daily.  Request last fasting lipid profile from Dr. Inda Merlin' office  - Blood pressure remains well-controlled  - Low threshold for cerebral angiogram, if she develops any new symptoms  - Encouraged her to stay active  - Strong warning signs discussed  Return to clinic in 6 months  Greater than 50% of this 15 minute visit was spent in counseling, explanation of diagnosis, planning of further management, and coordination of care.   Thank you for allowing me to participate in patient's care.  If I can answer any additional questions, I would be pleased to do so.    Sincerely,    Amira Podolak K. Posey Pronto, DO

## 2018-05-29 DIAGNOSIS — H26491 Other secondary cataract, right eye: Secondary | ICD-10-CM | POA: Diagnosis not present

## 2018-05-29 DIAGNOSIS — Z961 Presence of intraocular lens: Secondary | ICD-10-CM | POA: Diagnosis not present

## 2018-06-08 ENCOUNTER — Encounter: Payer: Self-pay | Admitting: Cardiology

## 2018-06-08 ENCOUNTER — Ambulatory Visit: Payer: Medicare HMO | Admitting: Cardiology

## 2018-06-08 VITALS — BP 112/64 | HR 63 | Ht 67.0 in | Wt 164.7 lb

## 2018-06-08 DIAGNOSIS — Z8673 Personal history of transient ischemic attack (TIA), and cerebral infarction without residual deficits: Secondary | ICD-10-CM | POA: Diagnosis not present

## 2018-06-08 DIAGNOSIS — I10 Essential (primary) hypertension: Secondary | ICD-10-CM | POA: Diagnosis not present

## 2018-06-08 DIAGNOSIS — R0989 Other specified symptoms and signs involving the circulatory and respiratory systems: Secondary | ICD-10-CM

## 2018-06-08 DIAGNOSIS — E7800 Pure hypercholesterolemia, unspecified: Secondary | ICD-10-CM

## 2018-06-08 DIAGNOSIS — R0609 Other forms of dyspnea: Secondary | ICD-10-CM

## 2018-06-08 NOTE — Progress Notes (Signed)
Subjective:   @Patient  ID: Andrea Landry, female    DOB: 1940-12-14, 78 y.o.   MRN: [id number]  Chief Complaint  Patient presents with follow-up of shortness of breath.  . Follow-up    2 month    HPI  Andrea Landry is a fairly active 78 year old Caucasian female with a medical history significant for tobacco use disorder with a 30-pack-year history quit about 20 years ago, mild COPD by recent PFT in 2018, hyperlipidemia, hypertension, history of TIA in April 2018 when she presented with right facial numbness and right arm numbness and weakness, with complete resolution, referred to me for abnormal stress test and worsening dyspnea on exertion for the past 6 months.  She also has history of right mastectomy for breast cancer in the remote past.  She is fairly active and plays golf twice a week and at least tries to exercise aerobically at home 2-3 times a week, but has noticed gradual worsening dyspnea on exertion and also reduced exercise capacity. She performed a routine treadmill exercise stress test a few months ago in which was only able to exercise for 4-1/2 minutes and had to stop due to dyspnea, nuclear perfusion imaging that was performed in the hospital by Lexiscan revealed anterolateral ischemia considered to be intermediate risk.  She was scheduled for echocardiogram and now presents for 2 month follow-up. With regard to abnormal stress test, initially we were planning on cardiac catheterization but per patient preference and also clinically felt to be low risk, medical therapy was recommended. Patient presents today to discuss medical management options. She is still exercising a normal amount. She admits to having exertional shortness of breath that has remained stable since last office visit.   Past Medical History:  Diagnosis Date  . Abnormal nuclear stress test   . Cancer (HCC)    BREAST CANCER  . DOE (dyspnea on exertion)   . GERD (gastroesophageal reflux disease)    . Hyperlipidemia   . Hypertension   . Osteopenia 03/2014   T score -1.4 FRAX 10%/1.8%. No change from prior DEXA  . SOB (shortness of breath)   . TIA (transient ischemic attack)     Past Surgical History:  Procedure Laterality Date  . BREAST ENHANCEMENT SURGERY     FOLLOWING MASTECTOMY FOR BREAST CANCER  . BREAST SURGERY     MULTIPLE BREAST BIOPSIES,   . MASTECTOMY     RIGHT BREAST  . right hammer toe and Bunion  2013    Social History   Socioeconomic History  . Marital status: Married    Spouse name: Not on file  . Number of children: 2  . Years of education: college  . Highest education level: Not on file  Occupational History  . Occupation: retired  Scientific laboratory technician  . Financial resource strain: Not on file  . Food insecurity:    Worry: Not on file    Inability: Not on file  . Transportation needs:    Medical: Not on file    Non-medical: Not on file  Tobacco Use  . Smoking status: Former Smoker    Packs/day: 1.00    Years: 40.00    Pack years: 40.00    Last attempt to quit: 1990    Years since quitting: 30.1  . Smokeless tobacco: Never Used  Substance and Sexual Activity  . Alcohol use: Yes    Alcohol/week: 10.0 standard drinks    Types: 10 Standard drinks or equivalent per week  Comment: 1-2 glasses per night  . Drug use: No  . Sexual activity: Never  Lifestyle  . Physical activity:    Days per week: Not on file    Minutes per session: Not on file  . Stress: Not on file  Relationships  . Social connections:    Talks on phone: Not on file    Gets together: Not on file    Attends religious service: Not on file    Active member of club or organization: Not on file    Attends meetings of clubs or organizations: Not on file    Relationship status: Not on file  . Intimate partner violence:    Fear of current or ex partner: Not on file    Emotionally abused: Not on file    Physically abused: Not on file    Forced sexual activity: Not on file  Other  Topics Concern  . Not on file  Social History Narrative   Lives with husband in a 2 story home.  Has 2 children.     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     Education: college.     Current Outpatient Medications on File Prior to Visit  Medication Sig Dispense Refill  . amLODipine (NORVASC) 5 MG tablet   6  . atorvastatin (LIPITOR) 20 MG tablet Take 1 tablet (20 mg total) by mouth daily. 90 tablet 3  . cholecalciferol (VITAMIN D) 1000 units tablet Take 1 tablet by mouth daily    . clonazePAM (KLONOPIN) 0.5 MG tablet Take 1 tablet by mouth daily as needed for anxiety or sleep    . clopidogrel (PLAVIX) 75 MG tablet Take 1 tablet (75 mg total) by mouth daily. 90 tablet 3  . diphenhydrAMINE (BENADRYL) 25 MG tablet Take 25 mg by mouth every 6 (six) hours as needed.    . famotidine (PEPCID) 10 MG tablet Take 10 mg by mouth daily.    . metoprolol tartrate (LOPRESSOR) 25 MG tablet Take 25 mg by mouth daily.     . Multiple Vitamins-Minerals (MULTIVITAMIN ADULT) TABS Take 1 tablet by mouth daily    . triamterene-hydrochlorothiazide (MAXZIDE-25) 37.5-25 MG tablet Take 0.5 tablets by mouth daily.     . pantoprazole (PROTONIX) 40 MG tablet   11   No current facility-administered medications on file prior to visit.      Review of Systems  Constitutional: Negative.   HENT: Negative for nosebleeds.   Eyes: Negative for blurred vision.  Respiratory: Positive for shortness of breath (mostly exertional ).   Cardiovascular: Negative for chest pain and palpitations.  Gastrointestinal: Negative for abdominal pain, nausea and vomiting.  Genitourinary: Negative for dysuria.  Musculoskeletal: Negative for myalgias.  Skin: Negative for itching and rash.  Neurological: Negative for dizziness and loss of consciousness.  Endo/Heme/Allergies: Does not bruise/bleed easily.  Psychiatric/Behavioral: The patient is not nervous/anxious.   All other systems reviewed and are negative.      Objective:    Blood pressure 112/64, pulse 63, height 5\' 7"  (1.702 m), weight 164 lb 11.2 oz (74.7 kg), last menstrual period 01/11/1996, SpO2 97 %.  Physical Exam  Constitutional: She appears well-developed and well-nourished. No distress.  HENT:  Head: Normocephalic.  Neck: Neck supple.  Cardiovascular: S1 normal, S2 normal and normal heart sounds.  Pulses:      Carotid pulses are on the left side with bruit.      Femoral pulses are 2+ on the right side and 2+ on the left side.  Popliteal pulses are 2+ on the right side and 2+ on the left side.       Dorsalis pedis pulses are 1+ on the right side and 1+ on the left side.       Posterior tibial pulses are 2+ on the right side and 2+ on the left side.  Pulmonary/Chest: Effort normal and breath sounds normal.  Skin: Skin is warm.    Echocardiogram 04/22/2018: Left ventricle cavity is normal in size. Mild concentric hypertrophy of the left ventricle. Normal global wall motion. Doppler evidence of grade II (pseudonormal) diastolic dysfunction. Diastolic dysfunction findings suggests elevated LA/LV end diastolic pressure. Calculated EF 61%. Left atrial cavity is mildly dilated in the apical views. Mild (Grade I) mitral regurgitation. Mild calcification of the mitral valve annulus. Trace tricuspid regurgitation. Unable to estimate PA pressure due to absence/minimal TR signal. IVC is dilated with poor inspiration collapse consistent with elevated right atrial pressure.   Lexiscan myoview stress test at [facility] 03/19/2018: 1. Moderate perfusion defect in the anterior septal wall with differential including mild reversible ischemia versus variable breast attenuation. No wall motion abnormality favors attenuation artifact. 2. Normal left ventricular wall motion. Left ventricular ejection fraction 62% 3. Non invasive risk stratification*: Intermediate   Assessment & Recommendations:   1.  Shortness of breath and dyspnea on exertion, mild COPD by  PFT in 2018.  Mildly abnormal nuclear stress test performed on 03/19/2018 with possible anteroseptal mild reversible ischemia versus breast exhalation and normal LVEF. 2.  History of TIA in April 2018, found to have mild carotid disease and intracerebral atherosclerosis.  No recurrence. 3.  History of tobacco use disorder, 30-pack-year history, quit sometime in 2000. 4.  Essential hypertension 5.  Hyperlipidemia 6.  Prominent left carotid bruit, soft right carotid bruit.  Recommendation: Patient's dyspnea may be related to underlying coronary artery disease however deconditioning and COPD may also be contributing.  She prefers continued medical therapy, was recommended coronary angiography for definitive diagnosis previously on her office visit.  She has not had any worsening symptoms, she may benefit from pulmonary rehab and reevaluation by pulmonary medicine if symptoms do not improve.  I had a lengthy discussion with the patient that if her symptoms were to persist or are getting worse over the next 3 to 6 months, we should certainly pursue invasive approach for CAD.  Otherwise she is on appropriate medical therapy, blood pressure is well controlled, she is on a statin for hyperlipidemia, will continue the same.  She has been on dual antiplatelet therapy since her stroke in 6789, we could certainly discontinue Plavix.  I had missed the carotid artery bruit on her previous physical exam, I have recommended that we obtain a carotid artery duplex.  I will see her back in 6 months or sooner if problems.  If carotid artery duplex does not reveal significant disease, we could consider discontinuing Plavix on her next office visit especially if her dyspnea remains stable.   Adrian Prows, MD, [facility] 06/08/2018, 1:26 PM [facility]. [place] Pager: [phone] Office: [phone] If no answer Cell [phone]

## 2018-06-09 ENCOUNTER — Encounter: Payer: Self-pay | Admitting: Cardiology

## 2018-06-26 ENCOUNTER — Other Ambulatory Visit: Payer: Self-pay | Admitting: Cardiology

## 2018-06-29 ENCOUNTER — Ambulatory Visit: Payer: Medicare HMO

## 2018-06-29 DIAGNOSIS — I10 Essential (primary) hypertension: Secondary | ICD-10-CM | POA: Diagnosis not present

## 2018-06-29 DIAGNOSIS — M1990 Unspecified osteoarthritis, unspecified site: Secondary | ICD-10-CM | POA: Diagnosis not present

## 2018-06-29 DIAGNOSIS — R0989 Other specified symptoms and signs involving the circulatory and respiratory systems: Secondary | ICD-10-CM | POA: Diagnosis not present

## 2018-06-29 DIAGNOSIS — J449 Chronic obstructive pulmonary disease, unspecified: Secondary | ICD-10-CM | POA: Diagnosis not present

## 2018-06-29 DIAGNOSIS — E785 Hyperlipidemia, unspecified: Secondary | ICD-10-CM | POA: Diagnosis not present

## 2018-06-29 DIAGNOSIS — M179 Osteoarthritis of knee, unspecified: Secondary | ICD-10-CM | POA: Diagnosis not present

## 2018-07-04 ENCOUNTER — Telehealth: Payer: Self-pay | Admitting: Cardiology

## 2018-07-04 NOTE — Telephone Encounter (Signed)
Minimal plaque in the carotid. No further f/u needed

## 2018-07-09 NOTE — Telephone Encounter (Signed)
LMOM advising of results.//ah

## 2018-07-09 NOTE — Telephone Encounter (Signed)
Pt returned call. Aware of results.//ah

## 2018-07-15 DIAGNOSIS — I679 Cerebrovascular disease, unspecified: Secondary | ICD-10-CM | POA: Diagnosis not present

## 2018-07-15 DIAGNOSIS — E785 Hyperlipidemia, unspecified: Secondary | ICD-10-CM | POA: Diagnosis not present

## 2018-07-15 DIAGNOSIS — I10 Essential (primary) hypertension: Secondary | ICD-10-CM | POA: Diagnosis not present

## 2018-07-15 DIAGNOSIS — Z0000 Encounter for general adult medical examination without abnormal findings: Secondary | ICD-10-CM | POA: Diagnosis not present

## 2018-07-15 DIAGNOSIS — J449 Chronic obstructive pulmonary disease, unspecified: Secondary | ICD-10-CM | POA: Diagnosis not present

## 2018-07-15 DIAGNOSIS — Z86010 Personal history of colonic polyps: Secondary | ICD-10-CM | POA: Diagnosis not present

## 2018-07-15 DIAGNOSIS — Z1389 Encounter for screening for other disorder: Secondary | ICD-10-CM | POA: Diagnosis not present

## 2018-07-15 DIAGNOSIS — E559 Vitamin D deficiency, unspecified: Secondary | ICD-10-CM | POA: Diagnosis not present

## 2018-07-15 DIAGNOSIS — M179 Osteoarthritis of knee, unspecified: Secondary | ICD-10-CM | POA: Diagnosis not present

## 2018-09-01 IMAGING — US US THYROID
2 series · 13 of 25 positions shown · non-contrast
Comparison: CT 09/10/2016

CLINICAL DATA: Nodule noted on recent CT.

EXAM:
THYROID ULTRASOUND
TECHNIQUE: Ultrasound examination of the thyroid gland and adjacent soft
tissues was performed.

[Series 1: us thyroid · 0.06mm/px · 44 acquisitions, 9 frames shown (1 of 2)]
[im 1/44]
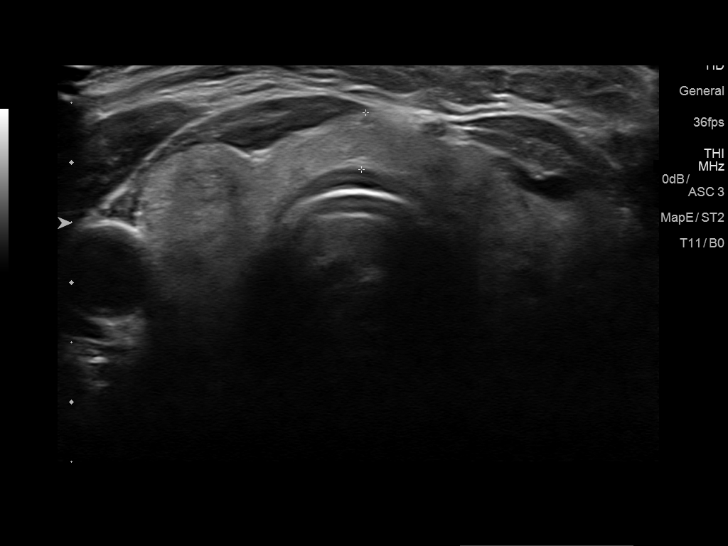
[im 6/44]
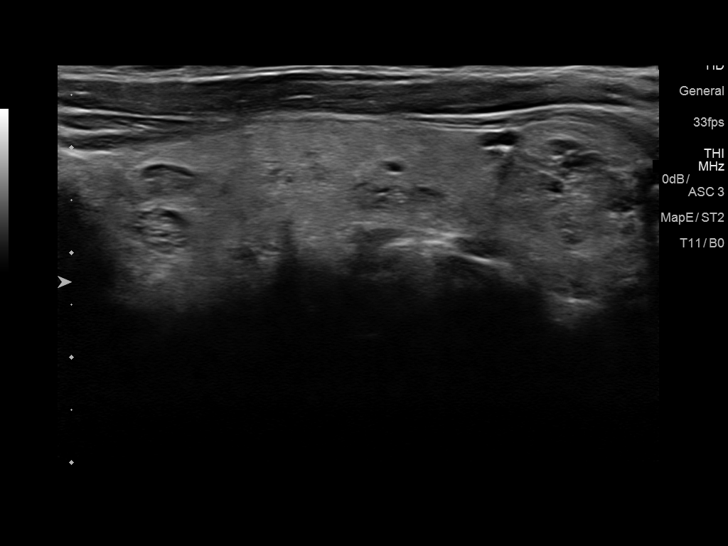
[im 11/44]
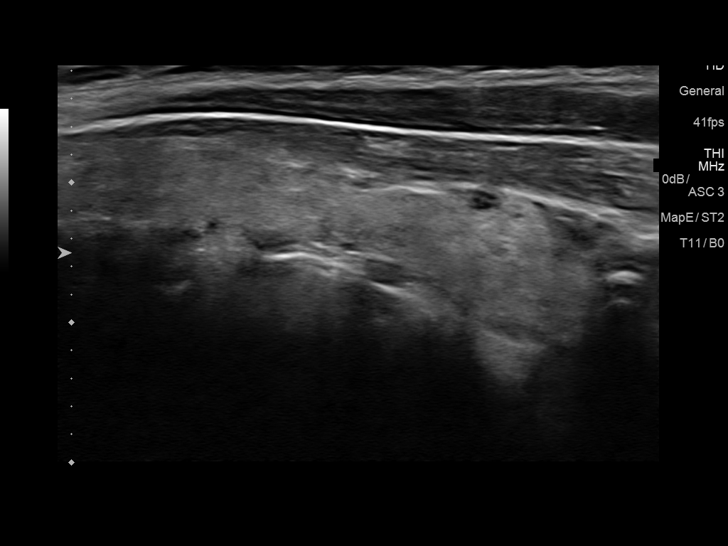
[im 17/44]
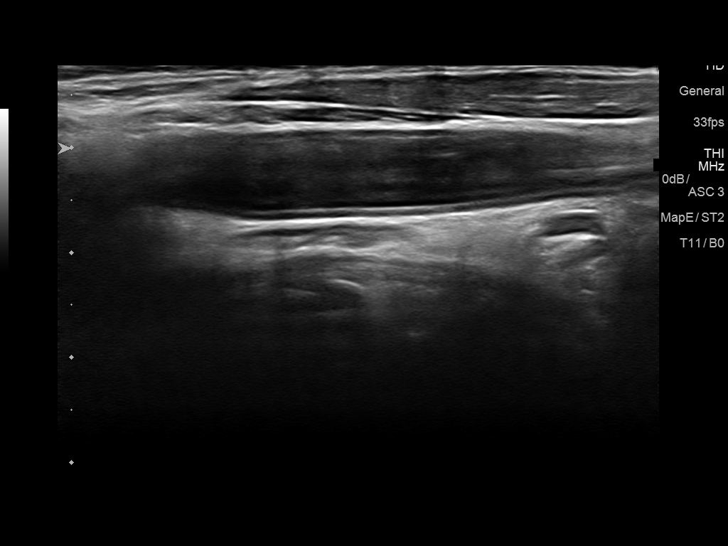
[im 22/44]
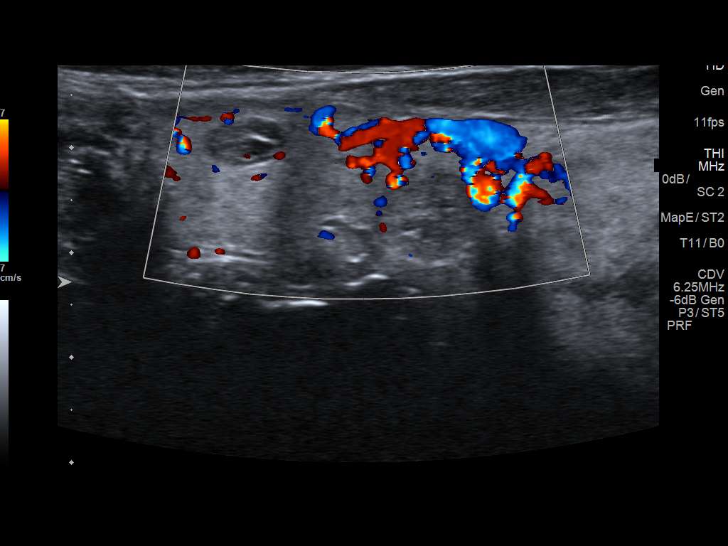
[im 27/44]
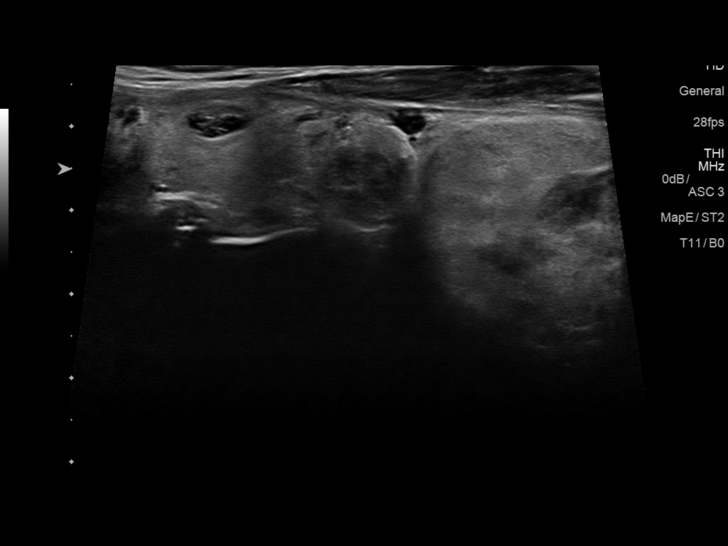
[im 33/44]
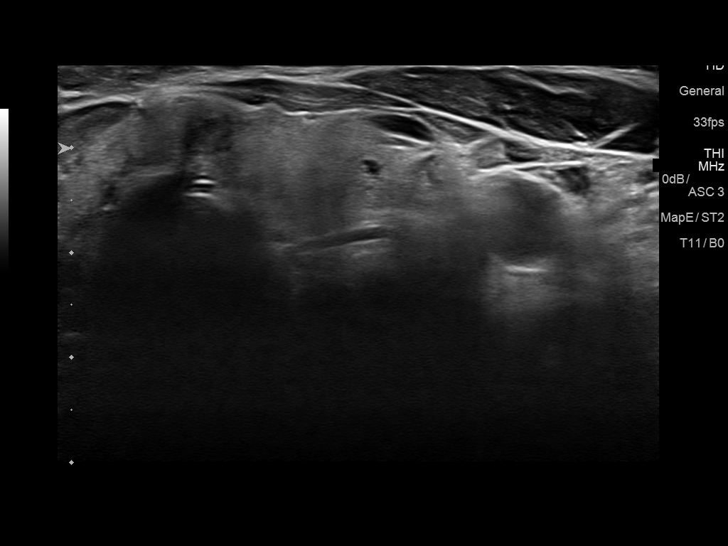
[im 38/44]
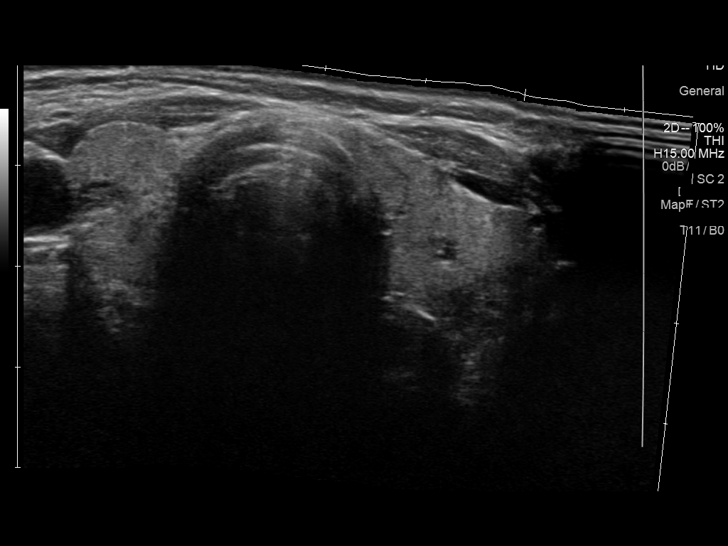
[im 44/44]
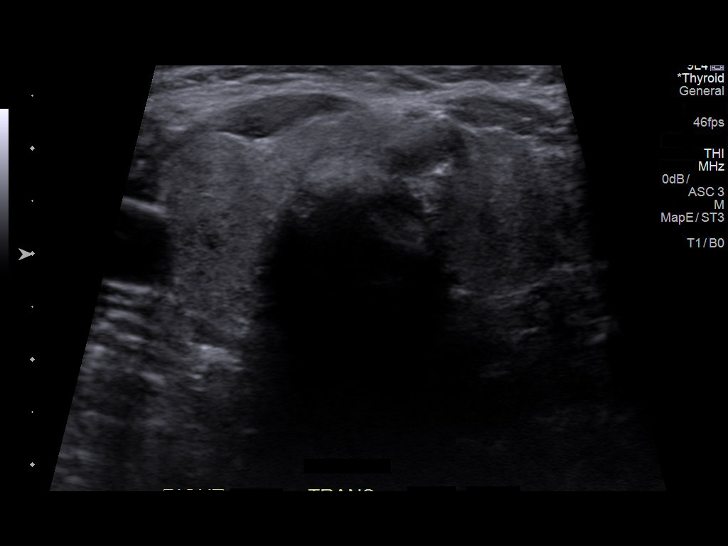

[Series 2001: us thyroid · 0.10mm/px · 4 of 21 slices shown (2 of 2)]
[im 3/21]
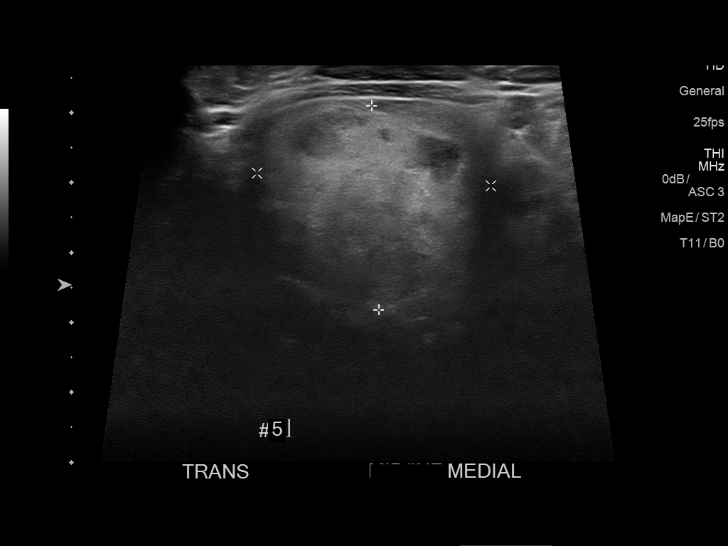
[im 9/21]
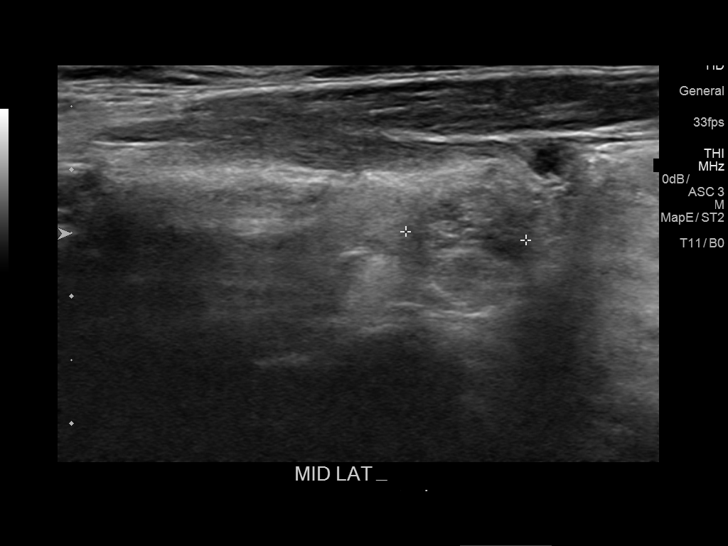
[im 15/21]
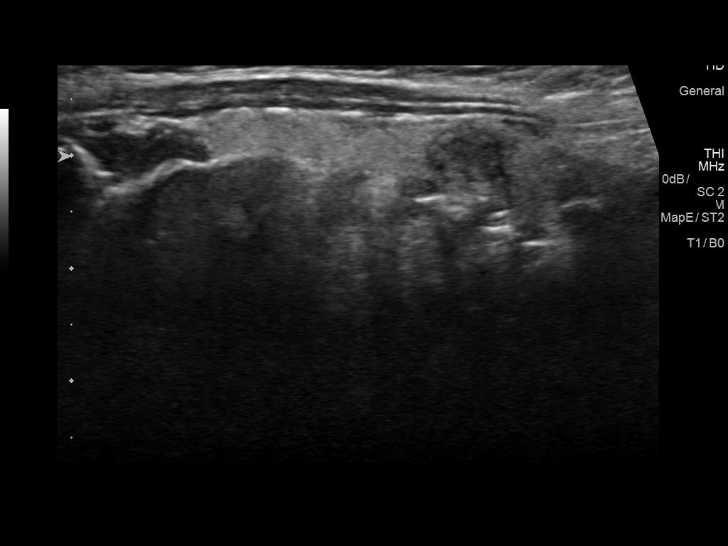
[im 21/21]
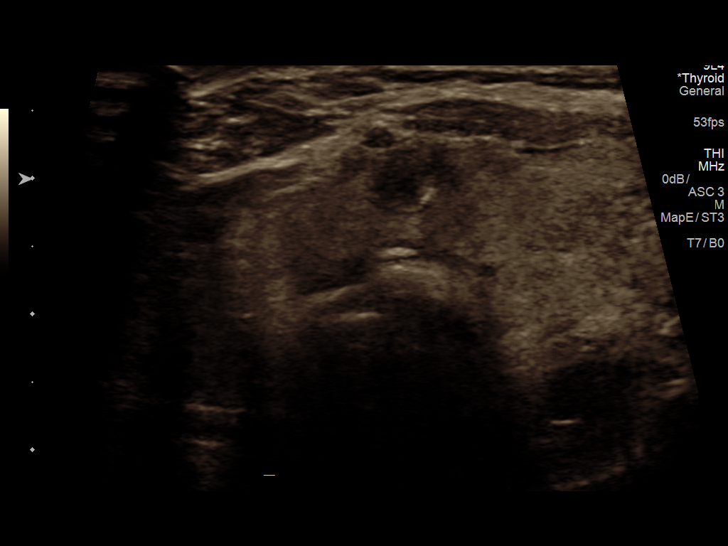

[13 of 25 positions shown; findings below may reference images not displayed]

FINDINGS: Parenchymal Echotexture: Mildly heterogenous

Isthmus: 0.5 cm thickness

Right lobe: 5.6 x 1.4 x 1.3 cm

Left lobe: 7.6 x 2.4 x 2 cm

_________________________________________________________

Estimated total number of nodules >/= 1 cm: 3

Number of spongiform nodules >/=  2 cm not described below (TR1): 0

Number of mixed cystic and solid nodules >/= 1.5 cm not described
below (TR2): 0

_________________________________________________________

Nodule # 1:

Location: Isthmus; right of midline

Maximum size: 1.6 cm; Other 2 dimensions: 1.1 x 1.1 cm

Composition: solid/almost completely solid (2)

Echogenicity: isoechoic (1)

Shape: not taller-than-wide (0)

Margins: ill-defined (0)

Echogenic foci: none (0)

ACR TI-RADS total points: 3.

ACR TI-RADS risk category: TR3 (3 points).

ACR TI-RADS recommendations:

*Given size (>/= 1.5 - 2.4 cm) and appearance, a follow-up
ultrasound in 1 year should be considered based on TI-RADS criteria.

_________________________________________________________

Nodule # 2:

Location: Left; Inferior

Maximum size: 4.3 cm; Other 2 dimensions: 2.9 x 3.4 cm

Composition: solid/almost completely solid (2)

Echogenicity: isoechoic (1)

Shape: not taller-than-wide (0)

Margins: ill-defined (0)

Echogenic foci: none (0)

ACR TI-RADS total points: 3.

ACR TI-RADS risk category: TR3 (3 points).

ACR TI-RADS recommendations:

**Given size (>/= 2.5 cm) and appearance, fine needle aspiration of
this mildly suspicious nodule should be considered based on TI-RADS
criteria.

_________________________________________________________

Nodule # 3:

Location: Left; Mid

Maximum size: 1 cm; Other 2 dimensions: 0.9 x 0.7 cm

Composition: solid/almost completely solid (2)

Echogenicity: isoechoic (1)

Shape: not taller-than-wide (0)

Margins: ill-defined (0)

Echogenic foci: none (0)

ACR TI-RADS total points: 3.

ACR TI-RADS risk category: TR3 (3 points).

ACR TI-RADS recommendations:

Given size (<1.4 cm) and appearance, this nodule does NOT meet
TI-RADS criteria for biopsy or dedicated follow-up.

_________________________________________________________

Multiple additional subcentimeter bilateral nodules without
calcifications.
IMPRESSION: 1. Thyromegaly with multiple nodules.
2. Recommend FNA biopsy of 4.3 cm mildly suspicious inferior left
nodule.
3. Recommend 1-2 year surveillance ultrasound until stability of
additional lesions x5 years demonstrated.

The above is in keeping with the ACR TI-RADS recommendations - [HOSPITAL] [id number];[DATE].

## 2018-09-11 DIAGNOSIS — I10 Essential (primary) hypertension: Secondary | ICD-10-CM | POA: Diagnosis not present

## 2018-09-11 DIAGNOSIS — J449 Chronic obstructive pulmonary disease, unspecified: Secondary | ICD-10-CM | POA: Diagnosis not present

## 2018-09-11 DIAGNOSIS — M179 Osteoarthritis of knee, unspecified: Secondary | ICD-10-CM | POA: Diagnosis not present

## 2018-09-11 DIAGNOSIS — E785 Hyperlipidemia, unspecified: Secondary | ICD-10-CM | POA: Diagnosis not present

## 2018-09-11 DIAGNOSIS — M1990 Unspecified osteoarthritis, unspecified site: Secondary | ICD-10-CM | POA: Diagnosis not present

## 2018-10-13 IMAGING — MR MR HEAD W/O CM
10 series · 48 of 48 positions shown · non-contrast
Comparison: None.

CLINICAL DATA: Confusion. Numbness in fingers and lips. History
breast cancer. Hypertension.

EXAM:
MRI HEAD WITHOUT CONTRAST
TECHNIQUE: Multiplanar, multiecho pulse sequences of the brain and surrounding
structures were obtained without intravenous contrast.

[Series 2: t1_se_sag · sagittal · 5.0mm · 0.45mm/px · 3 of 21 slices shown]
[im 1/21]
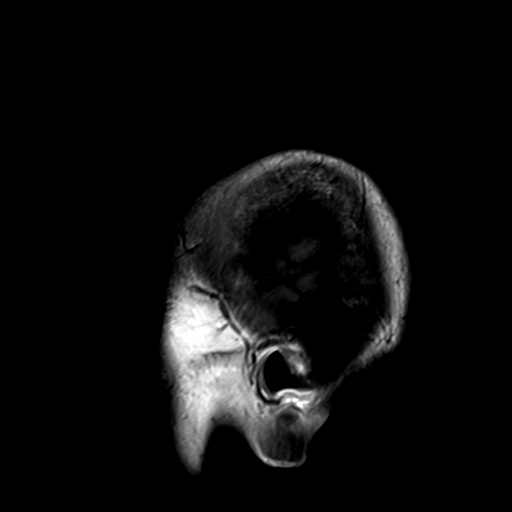
[im 11/21]
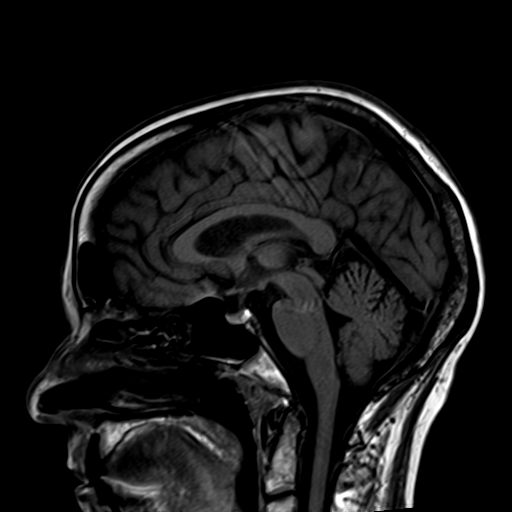
[im 21/21]
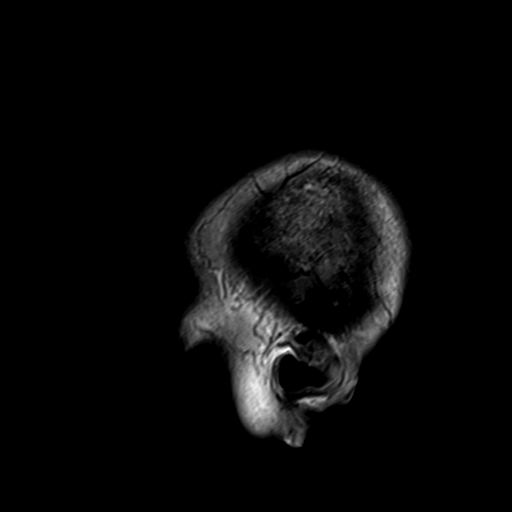

[Series 3: ep2d_diff_(id)_trace · axial · 3.0mm · 1.80mm/px · z∈[-26,+120]mm · 9 of 100 slices shown]
[im 1/100]
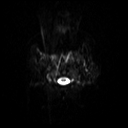
[im 13/100]
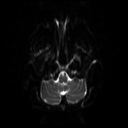
[im 25/100]
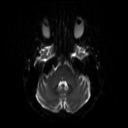
[im 38/100]
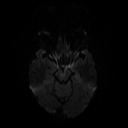
[im 50/100]
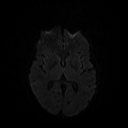
[im 62/100]
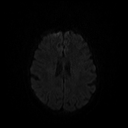
[im 75/100]
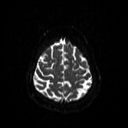
[im 87/100]
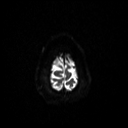
[im 100/100]
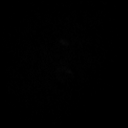

[Series 4: ep2d_diff_(id)_trace_adc · axial · 3.0mm · 1.80mm/px · z∈[-26,+120]mm · 4 of 49 slices shown]
[im 1/49]
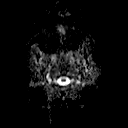
[im 17/49]
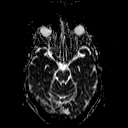
[im 33/49]
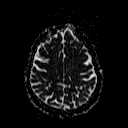
[im 49/49]
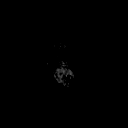

[Series 5: ep2d_diff_cor · coronal · 5.0mm · 1.77mm/px · 5 of 52 slices shown]
[im 1/52]
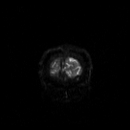
[im 13/52]
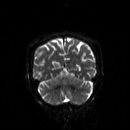
[im 26/52]
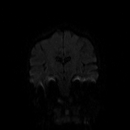
[im 39/52]
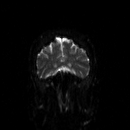
[im 52/52]
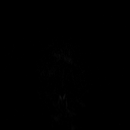

[Series 6: ep2d_diff_cor_adc · coronal · 5.0mm · 1.77mm/px · 2 of 26 slices shown]
[im 1/26]
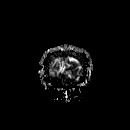
[im 26/26]
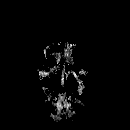

[Series 8: swi_images · axial · 2.0mm · 0.90mm/px · z∈[-20,+122]mm · 6 of 72 slices shown]
[im 1/72]
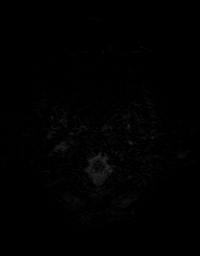
[im 15/72]
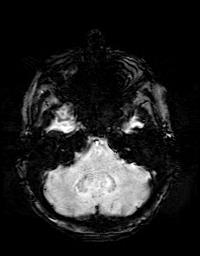
[im 29/72]
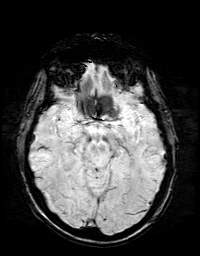
[im 43/72]
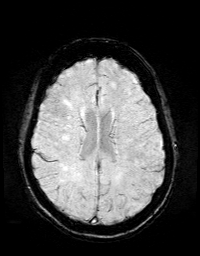
[im 57/72]
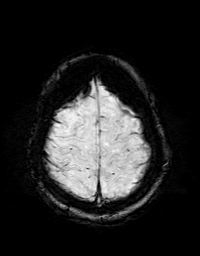
[im 72/72]
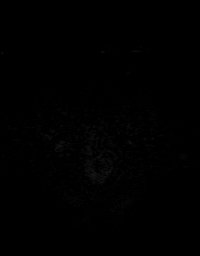

[Series 9: FLAIR · axial · 3.0mm · 0.43mm/px · z∈[-28,+115]mm · 2 of 25 slices shown]
[im 1/25]
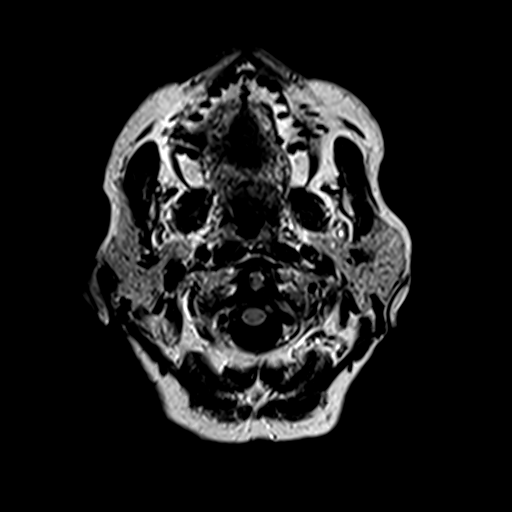
[im 25/25]
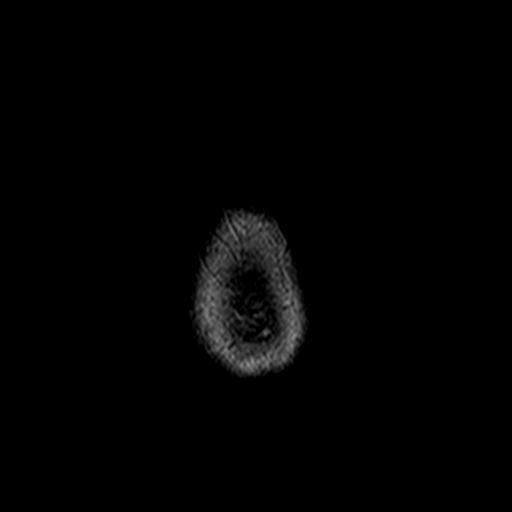

[Series 10: t2_tse_tra_512 · axial · 5.0mm · 0.60mm/px · z∈[-25,+113]mm · 2 of 24 slices shown]
[im 1/24]
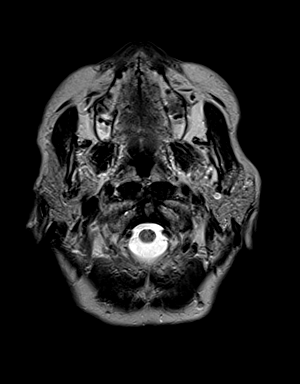
[im 24/24]
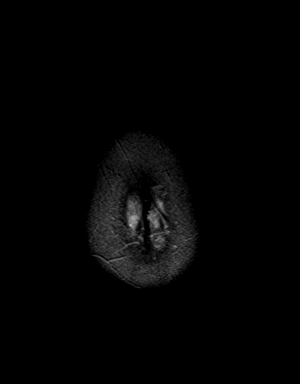

[Series 11: t1_mpr_tra · axial · 1.0mm · 0.72mm/px · z∈[-27,+115]mm · 13 of 144 slices shown]
[im 1/144]
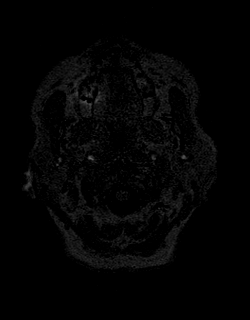
[im 12/144]
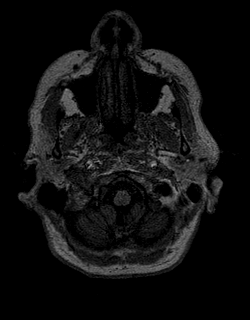
[im 24/144]
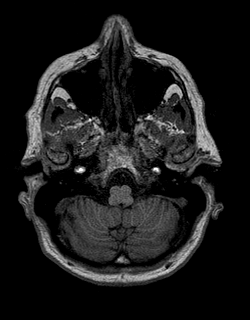
[im 36/144]
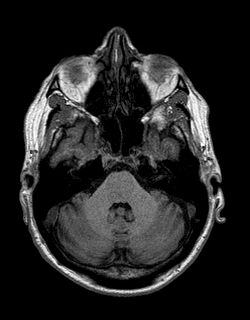
[im 48/144]
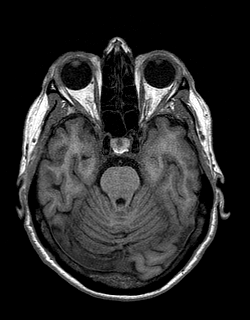
[im 60/144]
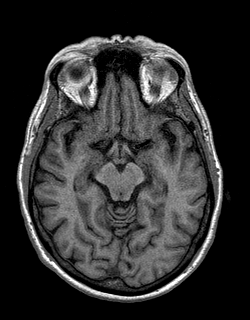
[im 72/144]
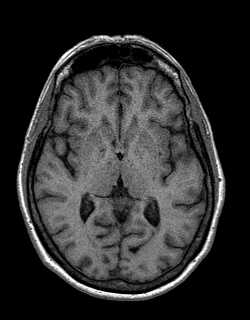
[im 84/144]
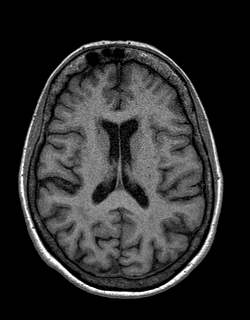
[im 96/144]
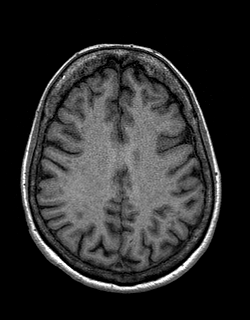
[im 108/144]
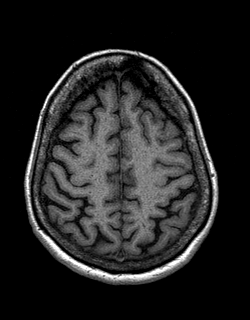
[im 120/144]
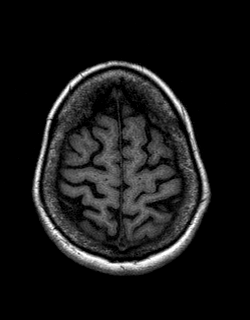
[im 132/144]
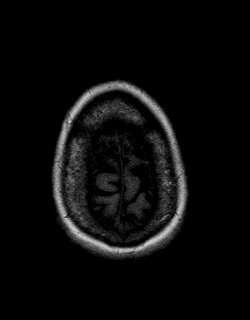
[im 144/144]
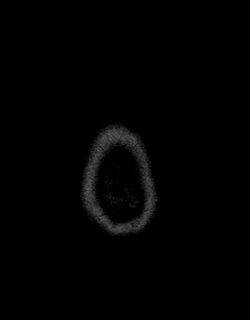

[Series 12: T2 · coronal · 5.0mm · 0.45mm/px · 2 of 27 slices shown]
[im 1/27]
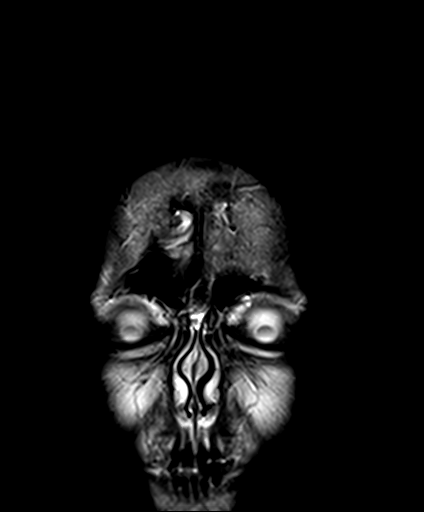
[im 27/27]
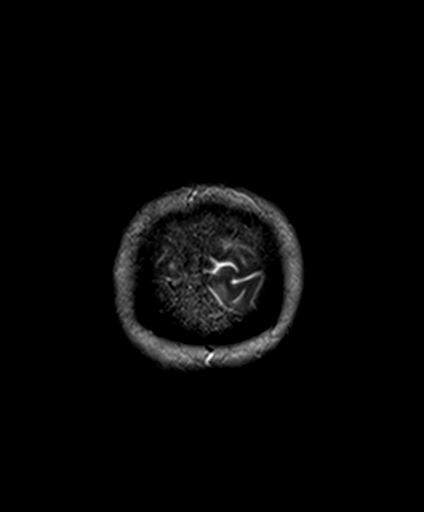

[48 of 48 positions shown; findings below may reference images not displayed]

FINDINGS: Brain: Ventricle size normal. Cerebral volume normal for age.
Negative for acute infarct. Multiple small hyperintensities
throughout the cerebral white matter bilaterally. Small
hyperintensities in the basal ganglia bilaterally. Brainstem normal.

Negative for hemorrhage or fluid collection. Negative for mass or
edema.

Vascular: Normal arterial flow void.

Skull and upper cervical spine: Negative

Sinuses/Orbits: Mucosal edema paranasal sinuses.  Normal orbit.

Other: None
IMPRESSION: Mild to moderate chronic microvascular ischemic changes in the white
matter. No acute intracranial abnormality

Mild mucosal edema in the paranasal sinuses.

## 2018-10-20 DIAGNOSIS — I10 Essential (primary) hypertension: Secondary | ICD-10-CM | POA: Diagnosis not present

## 2018-10-20 DIAGNOSIS — E785 Hyperlipidemia, unspecified: Secondary | ICD-10-CM | POA: Diagnosis not present

## 2018-10-20 DIAGNOSIS — M179 Osteoarthritis of knee, unspecified: Secondary | ICD-10-CM | POA: Diagnosis not present

## 2018-10-20 DIAGNOSIS — J449 Chronic obstructive pulmonary disease, unspecified: Secondary | ICD-10-CM | POA: Diagnosis not present

## 2018-10-20 DIAGNOSIS — M1990 Unspecified osteoarthritis, unspecified site: Secondary | ICD-10-CM | POA: Diagnosis not present

## 2018-11-16 ENCOUNTER — Other Ambulatory Visit: Payer: Self-pay

## 2018-11-16 ENCOUNTER — Ambulatory Visit (INDEPENDENT_AMBULATORY_CARE_PROVIDER_SITE_OTHER): Payer: Medicare HMO | Admitting: Neurology

## 2018-11-16 ENCOUNTER — Encounter: Payer: Self-pay | Admitting: Neurology

## 2018-11-16 VITALS — BP 106/60 | HR 72 | Ht 67.5 in | Wt 163.0 lb

## 2018-11-16 DIAGNOSIS — M9390 Osteochondropathy, unspecified of unspecified site: Secondary | ICD-10-CM | POA: Insufficient documentation

## 2018-11-16 DIAGNOSIS — L820 Inflamed seborrheic keratosis: Secondary | ICD-10-CM | POA: Insufficient documentation

## 2018-11-16 DIAGNOSIS — H6990 Unspecified Eustachian tube disorder, unspecified ear: Secondary | ICD-10-CM | POA: Insufficient documentation

## 2018-11-16 DIAGNOSIS — D126 Benign neoplasm of colon, unspecified: Secondary | ICD-10-CM | POA: Insufficient documentation

## 2018-11-16 DIAGNOSIS — M26609 Unspecified temporomandibular joint disorder, unspecified side: Secondary | ICD-10-CM | POA: Insufficient documentation

## 2018-11-16 DIAGNOSIS — M2040 Other hammer toe(s) (acquired), unspecified foot: Secondary | ICD-10-CM | POA: Insufficient documentation

## 2018-11-16 DIAGNOSIS — D7289 Other specified disorders of white blood cells: Secondary | ICD-10-CM | POA: Insufficient documentation

## 2018-11-16 DIAGNOSIS — M1710 Unilateral primary osteoarthritis, unspecified knee: Secondary | ICD-10-CM | POA: Insufficient documentation

## 2018-11-16 DIAGNOSIS — I679 Cerebrovascular disease, unspecified: Secondary | ICD-10-CM

## 2018-11-16 DIAGNOSIS — R630 Anorexia: Secondary | ICD-10-CM | POA: Insufficient documentation

## 2018-11-16 DIAGNOSIS — G4700 Insomnia, unspecified: Secondary | ICD-10-CM | POA: Insufficient documentation

## 2018-11-16 DIAGNOSIS — K219 Gastro-esophageal reflux disease without esophagitis: Secondary | ICD-10-CM | POA: Insufficient documentation

## 2018-11-16 DIAGNOSIS — N390 Urinary tract infection, site not specified: Secondary | ICD-10-CM | POA: Insufficient documentation

## 2018-11-16 DIAGNOSIS — R634 Abnormal weight loss: Secondary | ICD-10-CM | POA: Insufficient documentation

## 2018-11-16 DIAGNOSIS — R888 Abnormal findings in other body fluids and substances: Secondary | ICD-10-CM | POA: Insufficient documentation

## 2018-11-16 DIAGNOSIS — M79609 Pain in unspecified limb: Secondary | ICD-10-CM | POA: Insufficient documentation

## 2018-11-16 DIAGNOSIS — M1990 Unspecified osteoarthritis, unspecified site: Secondary | ICD-10-CM | POA: Insufficient documentation

## 2018-11-16 DIAGNOSIS — M179 Osteoarthritis of knee, unspecified: Secondary | ICD-10-CM | POA: Insufficient documentation

## 2018-11-16 DIAGNOSIS — Z79899 Other long term (current) drug therapy: Secondary | ICD-10-CM | POA: Insufficient documentation

## 2018-11-16 NOTE — Patient Instructions (Addendum)
We will request your last cholesterol panel from Dr. Inda Merlin   Continue your medications as you are taking   Return to clinic in 9 months

## 2018-11-16 NOTE — Progress Notes (Signed)
Follow-up Visit   Date: 11/16/18    Andrea Landry MRN: [id number] DOB: 1940/08/28   Interim History: Andrea Landry is a 78 y.o. right-handed Caucasian female with GERD, hypertension, history of breast cancer s/p right mastectomy (1999) returning to the clinic for follow-up of TIA.  The patient was accompanied to the clinic by self.  History of present illness: Initial visit 08/16/2016:  She was at church on March 25th and while talking, she developed numbness over the right corner of her lips and 4th and 5th digit on the right. At the same time, she was unable to express words and unable to spell when typing. She was still able to comprehend what was being said.  Symptoms lasted 30-minutes and completely resolved, but then would recur in the same day.  She was having ongoing symptoms almost daily until April 8th, these stereotyped spells could occur 5-10 times per day, lasting anywhere from minutes to several hours.  She described her speech difficulty as being "confused", but upon further questioning, denies disorientation, but moreso describes that she did not know what was happening to her during these events.There was no loss of consciousness or fatigue. On March 28th, she developed right cheek twitching which was constant for 3-days.  She went to the ER on 3/28 for these symptoms where MRI brain was ordered which did not show anything worrisome, specifically no sign of acute stroke or mass, there is scattered white matter changes. Of note, her CBC showed elevated platelet count of 647.  She followed up with her PCP who ordered US carotids which was normal and had repeat CBC with platelets 584.  I am not sure what her historical trend of platelets has been.  She was also started on aspirin 81mg  daily.   She did not have any new neurological symptoms in 2018.  She is compliant with plavix and aspirin which she takes for intracranial stenosis (left distal M1 stenosis).  She completed dual  antiplatelet therapy for 3 months, the continued plavix 75mg  daily.      UPDATE 11/16/2018: She is here for 79-month follow-up visit and is doing very well.  She has no longer has any tingling over the right side of the face or lips.  No other neurological symptoms.  She is compliant with taking her medications, which includes Plavix 75 mg and Lipitor 20 mg daily.  She has no new neurological complaints.   Medications:  Current Outpatient Medications on File Prior to Visit  Medication Sig Dispense Refill  . amLODipine (NORVASC) 5 MG tablet   6  . atorvastatin (LIPITOR) 20 MG tablet Take 1 tablet (20 mg total) by mouth daily. 90 tablet 3  . cholecalciferol (VITAMIN D) 1000 units tablet Take 1 tablet by mouth daily    . clonazePAM (KLONOPIN) 0.5 MG tablet Take 1 tablet by mouth daily as needed for anxiety or sleep    . clopidogrel (PLAVIX) 75 MG tablet Take 1 tablet (75 mg total) by mouth daily. 90 tablet 3  . diphenhydrAMINE (BENADRYL) 25 MG tablet Take 25 mg by mouth every 6 (six) hours as needed.    . famotidine (PEPCID) 10 MG tablet Take 10 mg by mouth daily.    . metoprolol tartrate (LOPRESSOR) 25 MG tablet Take 25 mg by mouth daily.     . Multiple Vitamins-Minerals (MULTIVITAMIN ADULT) TABS Take 1 tablet by mouth daily    . pantoprazole (PROTONIX) 40 MG tablet   11  . triamterene-hydrochlorothiazide (MAXZIDE-25)  37.5-25 MG tablet Take 0.5 tablets by mouth daily.      No current facility-administered medications on file prior to visit.     Allergies:  Allergies  Allergen Reactions  . Lisinopril Swelling    Review of Systems:  CONSTITUTIONAL: No fevers, chills, night sweats, or weight loss.  EYES: No visual changes or eye pain ENT: No hearing changes.  No history of nose bleeds.   RESPIRATORY: No cough, wheezing and shortness of breath.   CARDIOVASCULAR: Negative for chest pain, and palpitations.   GI: Negative for abdominal discomfort, blood in stools or black stools.  No recent  change in bowel habits.   GU:  No history of incontinence.   MUSCLOSKELETAL: No history of joint pain or swelling.  No myalgias.   SKIN: Negative for lesions, rash, and itching.   ENDOCRINE: Negative for cold or heat intolerance, polydipsia or goiter.   PSYCH:  No depression or anxiety symptoms.   NEURO: As Above.   Vital Signs:  BP 106/60   Pulse 72   Ht 5' 7.5" (1.715 m)   Wt 163 lb (73.9 kg)   LMP 01/11/1996   SpO2 94%   BMI 25.15 kg/m   General Medical Exam:   General:  Well appearing, comfortable  Eyes/ENT: see cranial nerve examination.   Neck:   No carotid bruits. Respiratory:  Clear to auscultation, good air entry bilaterally.   Cardiac:  Regular rate and rhythm, no murmur.   Ext:  No edema  Neurological Exam: MENTAL STATUS including orientation to time, place, person, recent and remote memory, attention span and concentration, language, and fund of knowledge is normal.  Speech is not dysarthric.  CRANIAL NERVES: Pupils equal round and reactive to light.  Normal conjugate, extra-ocular eye movements in all directions of gaze.  No ptosis.  Normal facial sensation.  Face is symmetric. Palate elevates symmetrically.  Tongue is midline.  MOTOR:  Motor strength is 5/5 in all extremities.  No pronator drift.   SENSORY:  Intact to light touch and vibration.  COORDINATION/GAIT:   Gait narrow based and stable.    Data: US carotids 08/13/2016:  No significant stenosis  MRI brain wo contrast 07/31/2016:  Mild to moderate chronic microvascular ischemic changes in the white matter. No acute intracranial abnormality.  Mild mucosal edema in the paranasal sinuses.  Routine EEG 08/16/2016: normal  CTA head 09/10/2016:  1. Critical distal left M1 segment stenosis. 2. Atheromatous type irregularity of left M2 branches. Elsewhere, intracranial vessels are unremarkable. 3. 4 cm thoracic inlet mass on a single slice through the chest for contrast timing. This is likely goiter or  thyroid nodule, recommend sonography for confirmation.  MRA head 08/30/2016:   1. Abrupt signal loss of the distal M1 segment of the left MCA is favored to be artifactual, as the distal branches are normal. However, a high-grade stenosis would be difficult to exclude. CTA of the brain should be considered for definitive characterization. 2. Otherwise normal intracranial MRA.  US carotids 06/29/2018:  No significant stenosis   IMPRESSION/PLAN: TIA manifesting with expressive aphasia, R arm and face paresthesias (07/2016) due to critical distal M1 stenosis, clinically doing well with no residual symptoms.  Exam remains stable.  - Continue secondary stroke prevention with Plavix 75 mg daily and atorvastatin 20 mg daily.  - I will request most recent lipid panel from her PCP  - Blood pressure remains well-controlled  - Low threshold for cerebral angiogram, if she develops any new symptoms  -  Encouraged her to stay active, which she does  Return to clinic in 9 months   Thank you for allowing me to participate in patient's care.  If I can answer any additional questions, I would be pleased to do so.    Sincerely,    Garnette Greb K. Posey Pronto, DO

## 2018-11-24 ENCOUNTER — Telehealth: Payer: Self-pay

## 2018-11-24 NOTE — Telephone Encounter (Signed)
-----   Message from Alda Berthold, DO sent at 11/24/2018  9:41 AM EDT ----- Please infprm pt that I rec'd her labs from Dr. Inda Merlin office and her cholesterol is in excellent control.  No additional labs needed. Continue medications as taking.  Thanks.

## 2018-11-24 NOTE — Progress Notes (Signed)
Addendum  Lipid panel rec'd from PCP's office dated 07/15/2018:  Chol 112   TG 138   LDL 33  HDL 51  Cholesterol panel is excellent, continue medications as already taking.

## 2018-11-24 NOTE — Progress Notes (Signed)
Left detailed message for patient.

## 2018-11-24 NOTE — Telephone Encounter (Signed)
Left detailed message informing patient of Dr Ena Dawley note.

## 2018-12-07 ENCOUNTER — Other Ambulatory Visit: Payer: Self-pay

## 2018-12-07 ENCOUNTER — Telehealth (INDEPENDENT_AMBULATORY_CARE_PROVIDER_SITE_OTHER): Payer: Medicare HMO | Admitting: Cardiology

## 2018-12-07 VITALS — Ht 67.5 in | Wt 163.0 lb

## 2018-12-07 DIAGNOSIS — E7800 Pure hypercholesterolemia, unspecified: Secondary | ICD-10-CM

## 2018-12-07 DIAGNOSIS — R0989 Other specified symptoms and signs involving the circulatory and respiratory systems: Secondary | ICD-10-CM

## 2018-12-07 DIAGNOSIS — R0609 Other forms of dyspnea: Secondary | ICD-10-CM

## 2018-12-07 DIAGNOSIS — Z8673 Personal history of transient ischemic attack (TIA), and cerebral infarction without residual deficits: Secondary | ICD-10-CM | POA: Diagnosis not present

## 2018-12-07 NOTE — Progress Notes (Signed)
Virtual Visit via Video Note: This visit type was conducted due to national recommendations for restrictions regarding the COVID-19 Pandemic (e.g. social distancing).  This format is felt to be most appropriate for this patient at this time.  All issues noted in this document were discussed and addressed.  No physical exam was performed (except for noted visual exam findings with Telehealth visits).  The patient has consented to conduct a Telehealth visit and understands insurance will be billed.   I connected with@, on 12/07/18 at  by a video enabled telemedicine application and verified that I am speaking with the correct person using two identifiers.   I discussed the limitations of evaluation and management by telemedicine and the availability of in person appointments. The patient expressed understanding and agreed to proceed.   I have discussed with patient regarding the safety during COVID Pandemic and steps and precautions to be taken including social distancing, frequent hand wash and use of detergent soap, gels with the patient. I asked the patient to avoid touching mouth, nose, eyes, ears with the hands. I encouraged regular walking around the neighborhood and exercise and regular diet, as long as social distancing can be maintained.  Primary Physician/Referring:  Josetta Huddle, MD  Patient ID: Andrea Landry, female    DOB: Jan 18, 1941, 78 y.o.   MRN: [id number]  Chief Complaints: Dyspnea on exertion HPI:    Andrea Landry  is a 78 y.o.  fairly active 78 year old Caucasian female with a medical history significant for tobacco use disorder with a 30-pack-year history quit about 20 years ago, mild COPD, hyperlipidemia, hypertension, history of TIA in April 2018 when she presented with right facial numbness and right arm numbness and weakness, with complete resolution, is here on a 6 month OV for dyspnea and f/u of abnormal stress test.  She also has history of right mastectomy for breast  cancer in the remote past.  She is fairly active and plays golf twice a week and at least tries to exercise aerobically at home 2-3 times a week, but has noticed gradual worsening dyspnea on exertion and also reduced exercise capacity. With regard to abnormal stress test, initially we were planning on cardiac catheterization but per patient preference and also clinically felt to be low risk, medical therapy was recommended. She is still exercising a normal amount. She admits to having exertional shortness of breath that has remained stable since last office visit or maybe improving some.  Past Medical History:  Diagnosis Date  . Abnormal nuclear stress test   . Cancer (HCC)    BREAST CANCER  . DOE (dyspnea on exertion)   . GERD (gastroesophageal reflux disease)   . Hyperlipidemia   . Hypertension   . Osteopenia 03/2014   T score -1.4 FRAX 10%/1.8%. No change from prior DEXA  . SOB (shortness of breath)   . TIA (transient ischemic attack)    Past Surgical History:  Procedure Laterality Date  . BREAST ENHANCEMENT SURGERY     FOLLOWING MASTECTOMY FOR BREAST CANCER  . BREAST SURGERY     MULTIPLE BREAST BIOPSIES,   . MASTECTOMY     RIGHT BREAST  . right hammer toe and Bunion  2013   Social History   Socioeconomic History  . Marital status: Married    Spouse name: Not on file  . Number of children: 2  . Years of education: college  . Highest education level: Not on file  Occupational History  . Occupation: retired  Science writer  Needs  . Financial resource strain: Not on file  . Food insecurity    Worry: Not on file    Inability: Not on file  . Transportation needs    Medical: Not on file    Non-medical: Not on file  Tobacco Use  . Smoking status: Former Smoker    Packs/day: 1.00    Years: 40.00    Pack years: 40.00    Quit date: 1990    Years since quitting: 30.6  . Smokeless tobacco: Never Used  Substance and Sexual Activity  . Alcohol use: Yes    Alcohol/week: 10.0  standard drinks    Types: 10 Standard drinks or equivalent per week    Comment: 1-2 glasses per night  . Drug use: No  . Sexual activity: Never  Lifestyle  . Physical activity    Days per week: Not on file    Minutes per session: Not on file  . Stress: Not on file  Relationships  . Social Herbalist on phone: Not on file    Gets together: Not on file    Attends religious service: Not on file    Active member of club or organization: Not on file    Attends meetings of clubs or organizations: Not on file    Relationship status: Not on file  . Intimate partner violence    Fear of current or ex partner: Not on file    Emotionally abused: Not on file    Physically abused: Not on file    Forced sexual activity: Not on file  Other Topics Concern  . Not on file  Social History Narrative   Lives with husband in a 2 story home.  Has 2 children.     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     Education: college.    Right handed.   ROS  Review of Systems  Constitution: Positive for malaise/fatigue. Negative for chills, decreased appetite and weight gain.  Cardiovascular: Positive for dyspnea on exertion. Negative for leg swelling and syncope.  Endocrine: Negative for cold intolerance.  Hematologic/Lymphatic: Does not bruise/bleed easily.  Musculoskeletal: Negative for joint swelling.  Gastrointestinal: Negative for anorexia, change in bowel habit, hematochezia and melena.  Neurological: Negative for headaches and light-headedness.  Psychiatric/Behavioral: Negative for depression and substance abuse.  All other systems reviewed and are negative.  Objective  Height 5' 7.5" (1.715 m), weight 163 lb (73.9 kg), last menstrual period 01/11/1996. Body mass index is 25.15 kg/m. Physical exam not performed or limited due to virtual visit.  Patient appeared to be in no distress, Neck was supple, respiration was not labored.  Please see exam details from prior visit is as  below.   Physical Exam  Constitutional: She appears well-developed and well-nourished. No distress.  HENT:  Head: Atraumatic.  Eyes: Conjunctivae are normal.  Neck: Neck supple. No JVD present. No thyromegaly present.  Cardiovascular: Normal rate, regular rhythm, normal heart sounds, intact distal pulses and normal pulses. Exam reveals no gallop.  No murmur heard. Pulses:      Carotid pulses are 2+ on the right side with bruit and 2+ on the left side. No edema. Vascular exam otherwise normal except for soft right carotid bruit  Pulmonary/Chest: Effort normal and breath sounds normal.  Abdominal: Soft. Bowel sounds are normal.  Musculoskeletal: Normal range of motion.  Neurological: She is alert.  Skin: Skin is warm and dry.  Psychiatric: She has a normal mood and affect.  Radiology: No results found.  Laboratory examination:   CMP Latest Ref Rng & Units 07/31/2016  Glucose 65 - 99 mg/dL 106(H)  BUN 6 - 20 mg/dL 17  Creatinine 0.44 - 1.00 mg/dL 0.80  Sodium 135 - 145 mmol/L 141  Potassium 3.5 - 5.1 mmol/L 3.9  Chloride 101 - 111 mmol/L 105  CO2 22 - 32 mmol/L 28  Calcium 8.9 - 10.3 mg/dL 9.6   CBC Latest Ref Rng & Units 07/31/2016  WBC 4.0 - 10.5 K/uL 10.8(H)  Hemoglobin 12.0 - 15.0 g/dL 15.3(H)  Hematocrit 36.0 - 46.0 % 46.1(H)  Platelets 150 - 400 K/uL 657(H)   Lipid Panel     Component Value Date/Time   CHOL 184 08/19/2016 0829   TRIG 146.0 08/19/2016 0829   HDL 55.70 08/19/2016 0829   CHOLHDL 3 08/19/2016 0829   VLDL 29.2 08/19/2016 0829   LDLCALC 99 08/19/2016 0829   HEMOGLOBIN A1C No results found for: HGBA1C, MPG TSH No results for input(s): TSH in the last 8760 hours. Medications   Current Outpatient Medications  Medication Instructions  . amLODipine (NORVASC) 5 mg, Oral, Daily  . atorvastatin (LIPITOR) 20 mg, Oral, Daily  . cholecalciferol (VITAMIN D) 1000 units tablet Take 1 tablet by mouth daily  . clonazePAM (KLONOPIN) 0.5 MG tablet Take 1 tablet  by mouth daily as needed for anxiety or sleep  . clopidogrel (PLAVIX) 75 mg, Oral, Daily  . diphenhydrAMINE (BENADRYL) 25 mg, Oral, Every 6 hours PRN  . loratadine (CLARITIN) 10 mg, Oral, Daily  . metoprolol succinate (TOPROL-XL) 25 mg, Oral, 2 times daily  . Multiple Vitamins-Minerals (MULTIVITAMIN ADULT) TABS Take 1 tablet by mouth daily  . pantoprazole (PROTONIX) 40 mg, Oral, Daily  . triamterene-hydrochlorothiazide (MAXZIDE-25) 37.5-25 MG tablet 0.5 tablets, Oral, Daily    Cardiac Studies:   PFT 02/24/2018: Conclusions: The diffusion defect is consistent with a pulmonary vascular process. Anemia cannot be excluded as a potential cause of the diffusion defect without correcting the observed diffusing capacity for hemoglobin.  Echocardiogram 04/22/2018: Left ventricle cavity is normal in size. Mild concentric hypertrophy of the left ventricle. Normal global wall motion. Doppler evidence of grade II (pseudonormal) diastolic dysfunction. Diastolic dysfunction findings suggests elevated LA/LV end diastolic pressure. Calculated EF 61%. Left atrial cavity is mildly dilated in the apical views. Mild (Grade I) mitral regurgitation. Mild calcification of the mitral valve annulus. Trace tricuspid regurgitation. Unable to estimate PA pressure due to absence/minimal TR signal. IVC is dilated with poor inspiration collapse consistent with elevated right atrial pressure.   Lexiscan myoview stress test at [facility] 03/19/2018: 1. Moderate perfusion defect in the anterior septal wall with differential including mild reversible ischemia versus variable breast attenuation. No wall motion abnormality favors attenuation artifact. 2. Normal left ventricular wall motion. Left ventricular ejection fraction 62% 3. Non invasive risk stratification*: Intermediate  Carotid artery duplex 06/29/2018: No hemodynamically significant arterial disease in the internal carotid artery bilaterally. Minimal plaque  noted. Antegrade right vertebral artery flow. Antegrade left vertebral artery flow.  Assessment     ICD-10-CM   1. Dyspnea on exertion  R06.09   2. Bruit of left carotid artery  R09.89   3. H/O TIA (transient ischemic attack) and stroke  Z86.73   4. Pure hypercholesterolemia  E78.00      Recommendations:   Patient fairly active, main complaint still consists of dyspnea on exertion which she states is improving with exercise. Continues to remain active and is still playing golf fairly regularly.  I reviewed the results of the previously performed nuclear stress test which reveals anterior wall ischemia but normal LVEF, intermediate risk study, attenuation artifact cannot be excluded.  Advised her that if symptoms do not improve that we should proceed with cardiac catheterization for definitive diagnosis.    She has not had any recurrent episodes of TIA-like symptoms since 2018, carotid artery duplex does not reveal significant carotid stenosis as well.  She is presently on statins for hyperlipidemia, this is being managed by her PCP Dr. Inda Merlin, I have recommended that LDL goal be less than 70 mg/dL.  I reviewed her carotid artery duplex and reassured her.    I would like to see her back in 1 year unless dyspnea continues to worsen, then she will need to be seen sooner and have a low threshold for cardiac catheterization.  Previously performed PFTs had revealed nonspecific findings as above.  Adrian Prows, MD, [facility] 12/07/2018, 11:48 AM [facility] [place] Pager: [phone] Office: [phone] If no answer Cell [phone]

## 2018-12-15 ENCOUNTER — Encounter: Payer: Self-pay | Admitting: Cardiology

## 2019-01-26 DIAGNOSIS — H31091 Other chorioretinal scars, right eye: Secondary | ICD-10-CM | POA: Diagnosis not present

## 2019-01-26 DIAGNOSIS — H43813 Vitreous degeneration, bilateral: Secondary | ICD-10-CM | POA: Diagnosis not present

## 2019-01-26 DIAGNOSIS — H35341 Macular cyst, hole, or pseudohole, right eye: Secondary | ICD-10-CM | POA: Diagnosis not present

## 2019-01-26 DIAGNOSIS — H35373 Puckering of macula, bilateral: Secondary | ICD-10-CM | POA: Diagnosis not present

## 2019-02-03 DIAGNOSIS — J449 Chronic obstructive pulmonary disease, unspecified: Secondary | ICD-10-CM | POA: Diagnosis not present

## 2019-02-03 DIAGNOSIS — I10 Essential (primary) hypertension: Secondary | ICD-10-CM | POA: Diagnosis not present

## 2019-02-03 DIAGNOSIS — M1990 Unspecified osteoarthritis, unspecified site: Secondary | ICD-10-CM | POA: Diagnosis not present

## 2019-02-03 DIAGNOSIS — E785 Hyperlipidemia, unspecified: Secondary | ICD-10-CM | POA: Diagnosis not present

## 2019-02-03 DIAGNOSIS — M179 Osteoarthritis of knee, unspecified: Secondary | ICD-10-CM | POA: Diagnosis not present

## 2019-02-11 ENCOUNTER — Encounter: Payer: Self-pay | Admitting: Gynecology

## 2019-03-24 DIAGNOSIS — I10 Essential (primary) hypertension: Secondary | ICD-10-CM | POA: Diagnosis not present

## 2019-03-24 DIAGNOSIS — M1990 Unspecified osteoarthritis, unspecified site: Secondary | ICD-10-CM | POA: Diagnosis not present

## 2019-03-24 DIAGNOSIS — M179 Osteoarthritis of knee, unspecified: Secondary | ICD-10-CM | POA: Diagnosis not present

## 2019-03-24 DIAGNOSIS — J449 Chronic obstructive pulmonary disease, unspecified: Secondary | ICD-10-CM | POA: Diagnosis not present

## 2019-03-24 DIAGNOSIS — E785 Hyperlipidemia, unspecified: Secondary | ICD-10-CM | POA: Diagnosis not present

## 2019-05-06 DIAGNOSIS — J449 Chronic obstructive pulmonary disease, unspecified: Secondary | ICD-10-CM | POA: Diagnosis not present

## 2019-05-06 DIAGNOSIS — M1990 Unspecified osteoarthritis, unspecified site: Secondary | ICD-10-CM | POA: Diagnosis not present

## 2019-05-06 DIAGNOSIS — M179 Osteoarthritis of knee, unspecified: Secondary | ICD-10-CM | POA: Diagnosis not present

## 2019-05-06 DIAGNOSIS — I10 Essential (primary) hypertension: Secondary | ICD-10-CM | POA: Diagnosis not present

## 2019-05-06 DIAGNOSIS — E785 Hyperlipidemia, unspecified: Secondary | ICD-10-CM | POA: Diagnosis not present

## 2019-05-19 DIAGNOSIS — Z1231 Encounter for screening mammogram for malignant neoplasm of breast: Secondary | ICD-10-CM | POA: Diagnosis not present

## 2019-05-20 ENCOUNTER — Ambulatory Visit: Payer: Medicare Other | Attending: Internal Medicine

## 2019-05-20 DIAGNOSIS — Z23 Encounter for immunization: Secondary | ICD-10-CM | POA: Insufficient documentation

## 2019-05-20 NOTE — Progress Notes (Signed)
   Covid-19 Vaccination Clinic  Name:  Andrea Landry    MRN: [id number] DOB: Oct 26, 1940  05/20/2019  Andrea Landry was observed post Covid-19 immunization for 30 minutes based on pre-vaccination screening without incidence. She was provided with Vaccine Information Sheet and instruction to access the V-Safe system.   Andrea Landry was instructed to call 911 with any severe reactions post vaccine: Marland Kitchen Difficulty breathing  . Swelling of your face and throat  . A fast heartbeat  . A bad rash all over your body  . Dizziness and weakness    Immunizations Administered    Name Date Dose VIS Date Route   Pfizer COVID-19 Vaccine 05/20/2019  9:58 AM 0.3 mL 04/16/2019 Intramuscular   Manufacturer: [place]   Lot: [id number]   [place]: [id number]

## 2019-06-02 DIAGNOSIS — M1990 Unspecified osteoarthritis, unspecified site: Secondary | ICD-10-CM | POA: Diagnosis not present

## 2019-06-02 DIAGNOSIS — I10 Essential (primary) hypertension: Secondary | ICD-10-CM | POA: Diagnosis not present

## 2019-06-02 DIAGNOSIS — J449 Chronic obstructive pulmonary disease, unspecified: Secondary | ICD-10-CM | POA: Diagnosis not present

## 2019-06-02 DIAGNOSIS — E785 Hyperlipidemia, unspecified: Secondary | ICD-10-CM | POA: Diagnosis not present

## 2019-06-02 DIAGNOSIS — M179 Osteoarthritis of knee, unspecified: Secondary | ICD-10-CM | POA: Diagnosis not present

## 2019-06-08 ENCOUNTER — Ambulatory Visit: Payer: Medicare HMO | Attending: Internal Medicine

## 2019-06-08 DIAGNOSIS — Z23 Encounter for immunization: Secondary | ICD-10-CM

## 2019-06-08 NOTE — Progress Notes (Signed)
   Covid-19 Vaccination Clinic  Name:  Andrea Landry    MRN: [id number] DOB: 04/04/1941  06/08/2019  [name] was observed post Covid-19 immunization for 30 minutes based on pre-vaccination screening without incidence. She was provided with Vaccine Information Sheet and instruction to access the V-Safe system.   [name] was instructed to call 911 with any severe reactions post vaccine: Marland Kitchen Difficulty breathing  . Swelling of your face and throat  . A fast heartbeat  . A bad rash all over your body  . Dizziness and weakness    Immunizations Administered    Name Date Dose VIS Date Route   Pfizer COVID-19 Vaccine 06/08/2019  8:24 AM 0.3 mL 04/16/2019 Intramuscular   Manufacturer: [place]   Lot: [id number]   [place]: [id number]

## 2019-06-14 DIAGNOSIS — H35341 Macular cyst, hole, or pseudohole, right eye: Secondary | ICD-10-CM | POA: Diagnosis not present

## 2019-06-14 DIAGNOSIS — Z961 Presence of intraocular lens: Secondary | ICD-10-CM | POA: Diagnosis not present

## 2019-08-04 DIAGNOSIS — M8580 Other specified disorders of bone density and structure, unspecified site: Secondary | ICD-10-CM | POA: Diagnosis not present

## 2019-08-04 DIAGNOSIS — E785 Hyperlipidemia, unspecified: Secondary | ICD-10-CM | POA: Diagnosis not present

## 2019-08-04 DIAGNOSIS — Z0001 Encounter for general adult medical examination with abnormal findings: Secondary | ICD-10-CM | POA: Diagnosis not present

## 2019-08-04 DIAGNOSIS — I10 Essential (primary) hypertension: Secondary | ICD-10-CM | POA: Diagnosis not present

## 2019-08-04 DIAGNOSIS — M1990 Unspecified osteoarthritis, unspecified site: Secondary | ICD-10-CM | POA: Diagnosis not present

## 2019-08-04 DIAGNOSIS — G4700 Insomnia, unspecified: Secondary | ICD-10-CM | POA: Diagnosis not present

## 2019-08-04 DIAGNOSIS — J449 Chronic obstructive pulmonary disease, unspecified: Secondary | ICD-10-CM | POA: Diagnosis not present

## 2019-08-04 DIAGNOSIS — M179 Osteoarthritis of knee, unspecified: Secondary | ICD-10-CM | POA: Diagnosis not present

## 2019-08-04 DIAGNOSIS — Z1389 Encounter for screening for other disorder: Secondary | ICD-10-CM | POA: Diagnosis not present

## 2019-08-04 DIAGNOSIS — I679 Cerebrovascular disease, unspecified: Secondary | ICD-10-CM | POA: Diagnosis not present

## 2019-08-04 DIAGNOSIS — Z23 Encounter for immunization: Secondary | ICD-10-CM | POA: Diagnosis not present

## 2019-08-04 DIAGNOSIS — E559 Vitamin D deficiency, unspecified: Secondary | ICD-10-CM | POA: Diagnosis not present

## 2019-08-04 LAB — HM AWV

## 2019-08-12 ENCOUNTER — Telehealth: Payer: Self-pay | Admitting: Hematology and Oncology

## 2019-08-12 NOTE — Telephone Encounter (Signed)
Received a new hem referral from Dr. Inda Merlin from [facility] [place] for elevated platelets. Pt has been cld and scheduled to see Dr. Lindi Adie on 4/14 at 11:15 minutes early.

## 2019-08-17 NOTE — Progress Notes (Signed)
[facility] NOTE  Patient Care Team: Josetta Huddle, MD as PCP - General (Internal Medicine)  CHIEF COMPLAINTS/PURPOSE OF CONSULTATION:  Newly diagnosed thrombocytosis   HISTORY OF PRESENTING ILLNESS:  Andrea Landry 79 y.o. female is here because of recent diagnosis of thrombocytosis. She is referred by Dr. Josetta Huddle at [facility], her PCP. Labs on 08/04/19 showed platelets 884. She presents to the clinic today for initial evaluation and discussion of treatment options.   I reviewed her records extensively and collaborated the history with the patient.  MEDICAL HISTORY:  Past Medical History:  Diagnosis Date  . Abnormal nuclear stress test   . Cancer (HCC)    BREAST CANCER  . DOE (dyspnea on exertion)   . GERD (gastroesophageal reflux disease)   . Hyperlipidemia   . Hypertension   . Osteopenia 03/2014   T score -1.4 FRAX 10%/1.8%. No change from prior DEXA  . SOB (shortness of breath)   . TIA (transient ischemic attack)     SURGICAL HISTORY: Past Surgical History:  Procedure Laterality Date  . BREAST ENHANCEMENT SURGERY     FOLLOWING MASTECTOMY FOR BREAST CANCER  . BREAST SURGERY     MULTIPLE BREAST BIOPSIES,   . MASTECTOMY     RIGHT BREAST  . right hammer toe and Bunion  2013    SOCIAL HISTORY: Social History   Socioeconomic History  . Marital status: Married    Spouse name: Not on file  . Number of children: 2  . Years of education: college  . Highest education level: Not on file  Occupational History  . Occupation: retired  Tobacco Use  . Smoking status: Former Smoker    Packs/day: 1.00    Years: 40.00    Pack years: 40.00    Quit date: 1990    Years since quitting: 31.3  . Smokeless tobacco: Never Used  Substance and Sexual Activity  . Alcohol use: Yes    Alcohol/week: 10.0 standard drinks    Types: 10 Standard drinks or equivalent per week    Comment: 1-2 glasses per night  . Drug use: No  . Sexual activity:  Never  Other Topics Concern  . Not on file  Social History Narrative   Lives with husband in a 2 story home.  Has 2 children.     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     Education: college.    Right handed.   Social Determinants of Health   Financial Resource Strain:   . Difficulty of Paying Living Expenses:   Food Insecurity:   . Worried About Charity fundraiser in the Last Year:   . Arboriculturist in the Last Year:   Transportation Needs:   . Film/video editor (Medical):   Marland Kitchen Lack of Transportation (Non-Medical):   Physical Activity:   . Days of Exercise per Week:   . Minutes of Exercise per Session:   Stress:   . Feeling of Stress :   Social Connections:   . Frequency of Communication with Friends and Family:   . Frequency of Social Gatherings with Friends and Family:   . Attends Religious Services:   . Active Member of Clubs or Organizations:   . Attends Archivist Meetings:   Marland Kitchen Marital Status:   Intimate Partner Violence:   . Fear of Current or Ex-Partner:   . Emotionally Abused:   Marland Kitchen Physically Abused:   . Sexually Abused:     FAMILY  HISTORY: Family History  Problem Relation Age of Onset  . Cancer Mother        RENAL  . Other Father   . Cerebral palsy Daughter   . Healthy Son     ALLERGIES:  is allergic to lisinopril.  MEDICATIONS:  Current Outpatient Medications  Medication Sig Dispense Refill  . amLODipine (NORVASC) 5 MG tablet Take 5 mg by mouth daily.   6  . atorvastatin (LIPITOR) 20 MG tablet Take 1 tablet (20 mg total) by mouth daily. 90 tablet 3  . cholecalciferol (VITAMIN D) 1000 units tablet Take 1 tablet by mouth daily    . clonazePAM (KLONOPIN) 0.5 MG tablet Take 1 tablet by mouth daily as needed for anxiety or sleep    . clopidogrel (PLAVIX) 75 MG tablet Take 1 tablet (75 mg total) by mouth daily. 90 tablet 3  . diphenhydrAMINE (BENADRYL) 25 MG tablet Take 25 mg by mouth every 6 (six) hours as needed.    . loratadine  (CLARITIN) 10 MG tablet Take 10 mg by mouth daily.    . metoprolol succinate (TOPROL-XL) 25 MG 24 hr tablet Take 25 mg by mouth 2 (two) times daily.    . Multiple Vitamins-Minerals (MULTIVITAMIN ADULT) TABS Take 1 tablet by mouth daily    . pantoprazole (PROTONIX) 40 MG tablet Take 40 mg by mouth daily.   11  . triamterene-hydrochlorothiazide (MAXZIDE-25) 37.5-25 MG tablet Take 0.5 tablets by mouth daily.      No current facility-administered medications for this visit.    REVIEW OF SYSTEMS:   Constitutional: Denies fevers, chills or abnormal night sweats Eyes: Denies blurriness of vision, double vision or watery eyes Ears, nose, mouth, throat, and face: Denies mucositis or sore throat Respiratory: Denies cough, dyspnea or wheezes Cardiovascular: Denies palpitation, chest discomfort or lower extremity swelling Gastrointestinal:  Denies nausea, heartburn or change in bowel habits Skin: Denies abnormal skin rashes Lymphatics: Denies new lymphadenopathy or easy bruising Neurological:Denies numbness, tingling or new weaknesses Behavioral/Psych: Mood is stable, no new changes  Breast: Denies any palpable lumps or discharge All other systems were reviewed with the patient and are negative.  PHYSICAL EXAMINATION: ECOG PERFORMANCE STATUS: 1 - Symptomatic but completely ambulatory  There were no vitals filed for this visit. There were no vitals filed for this visit.  GENERAL:alert, no distress and comfortable SKIN: skin color, texture, turgor are normal, no rashes or significant lesions EYES: normal, conjunctiva are pink and non-injected, sclera clear OROPHARYNX:no exudate, no erythema and lips, buccal mucosa, and tongue normal  NECK: supple, thyroid normal size, non-tender, without nodularity LYMPH:  no palpable lymphadenopathy in the cervical, axillary or inguinal LUNGS: clear to auscultation and percussion with normal breathing effort HEART: regular rate & rhythm and no murmurs and no  lower extremity edema ABDOMEN:abdomen soft, non-tender and normal bowel sounds Musculoskeletal:no cyanosis of digits and no clubbing  PSYCH: alert & oriented x 3 with fluent speech NEURO: no focal motor/sensory deficits  LABORATORY DATA:  I have reviewed the data as listed Lab Results  Component Value Date   WBC 10.8 (H) 07/31/2016   HGB 15.3 (H) 07/31/2016   HCT 46.1 (H) 07/31/2016   MCV 83.1 07/31/2016   PLT 657 (H) 07/31/2016   Lab Results  Component Value Date   NA 141 07/31/2016   K 3.9 07/31/2016   CL 105 07/31/2016   CO2 28 07/31/2016    RADIOGRAPHIC STUDIES: I have personally reviewed the radiological reports and agreed with the findings in the  report.  ASSESSMENT AND PLAN:  Thrombocytosis ([place]) 07/31/2016: hemoglobin 15.3, Platelet count 657  08/05/2019: Hemoglobin 14.4, platelet count 884  Differential diagnosis 1. Primary thrombocytosis: Related to myeloproliferative disorders of the bone marrow especially essential thrombocytosis and CML. I would like to send for BCR-ABL as well as JAK-2 dictation testings. Patient understands that JAK2 mutation is only present in 50% of essential thrombocytosis so the test is advantageous only if it is positive. If it is negative, it does not rule out. 2. Secondary/reactive thrombocytosis Different causes including infections, inflammation, iron deficiency.  I would like to send out for C-reactive protein, iron studies with ferritin to complete the workup.  Treatment options: 1. If it is primary essential thrombocytosis, treatment would depend on platelet count level as well as history of thrombosis. A. For low risk patients, (platelet counts less than 1000 and no history of blood clots) the treatment would be with aspirin therapy B. for high risk patients(platelet counts greater than 1000/history of blood clot) the treatment would be platelet lowering therapy with aspirin 2. Treatment of secondary thrombocytosis would be to treat  underlying cause. There would not be any risk of thrombosis with secondary thrombocytosis.  Return to clinic in 2 weeks to discuss the results of these tests.    All questions were answered. The patient knows to call the clinic with any problems, questions or concerns.   Rulon Eisenmenger, MD, MPH 08/18/2019    I, Molly Dorshimer, am acting as scribe for Nicholas Lose, MD.  I have reviewed the above documentation for accuracy and completeness, and I agree with the above.

## 2019-08-18 ENCOUNTER — Inpatient Hospital Stay: Payer: Medicare HMO | Attending: Hematology and Oncology | Admitting: Hematology and Oncology

## 2019-08-18 ENCOUNTER — Inpatient Hospital Stay: Payer: Medicare HMO

## 2019-08-18 ENCOUNTER — Other Ambulatory Visit: Payer: Self-pay

## 2019-08-18 DIAGNOSIS — Z8673 Personal history of transient ischemic attack (TIA), and cerebral infarction without residual deficits: Secondary | ICD-10-CM | POA: Diagnosis not present

## 2019-08-18 DIAGNOSIS — I10 Essential (primary) hypertension: Secondary | ICD-10-CM | POA: Diagnosis not present

## 2019-08-18 DIAGNOSIS — E785 Hyperlipidemia, unspecified: Secondary | ICD-10-CM | POA: Insufficient documentation

## 2019-08-18 DIAGNOSIS — Z79899 Other long term (current) drug therapy: Secondary | ICD-10-CM | POA: Diagnosis not present

## 2019-08-18 DIAGNOSIS — Z853 Personal history of malignant neoplasm of breast: Secondary | ICD-10-CM | POA: Insufficient documentation

## 2019-08-18 DIAGNOSIS — M8580 Other specified disorders of bone density and structure, unspecified site: Secondary | ICD-10-CM | POA: Diagnosis not present

## 2019-08-18 DIAGNOSIS — D75839 Thrombocytosis, unspecified: Secondary | ICD-10-CM

## 2019-08-18 DIAGNOSIS — R7989 Other specified abnormal findings of blood chemistry: Secondary | ICD-10-CM | POA: Diagnosis not present

## 2019-08-18 DIAGNOSIS — Z87891 Personal history of nicotine dependence: Secondary | ICD-10-CM | POA: Insufficient documentation

## 2019-08-18 DIAGNOSIS — K219 Gastro-esophageal reflux disease without esophagitis: Secondary | ICD-10-CM | POA: Insufficient documentation

## 2019-08-18 DIAGNOSIS — D473 Essential (hemorrhagic) thrombocythemia: Secondary | ICD-10-CM | POA: Diagnosis not present

## 2019-08-18 LAB — CBC WITH DIFFERENTIAL (CANCER CENTER ONLY)
Abs Immature Granulocytes: 0.06 10*3/uL (ref 0.00–0.07)
Basophils Absolute: 0.2 10*3/uL — ABNORMAL HIGH (ref 0.0–0.1)
Basophils Relative: 1 %
Eosinophils Absolute: 0.3 10*3/uL (ref 0.0–0.5)
Eosinophils Relative: 3 %
HCT: 38.6 % (ref 36.0–46.0)
Hemoglobin: 12.3 g/dL (ref 12.0–15.0)
Immature Granulocytes: 1 %
Lymphocytes Relative: 16 %
Lymphs Abs: 1.7 10*3/uL (ref 0.7–4.0)
MCH: 29.9 pg (ref 26.0–34.0)
MCHC: 31.9 g/dL (ref 30.0–36.0)
MCV: 93.9 fL (ref 80.0–100.0)
Monocytes Absolute: 0.9 10*3/uL (ref 0.1–1.0)
Monocytes Relative: 8 %
Neutro Abs: 7.8 10*3/uL — ABNORMAL HIGH (ref 1.7–7.7)
Neutrophils Relative %: 71 %
Platelet Count: 879 10*3/uL — ABNORMAL HIGH (ref 150–400)
RBC: 4.11 MIL/uL (ref 3.87–5.11)
RDW: 14.4 % (ref 11.5–15.5)
WBC Count: 10.9 10*3/uL — ABNORMAL HIGH (ref 4.0–10.5)
nRBC: 0 % (ref 0.0–0.2)

## 2019-08-18 LAB — IRON AND TIBC
Iron: 60 ug/dL (ref 41–142)
Saturation Ratios: 20 % — ABNORMAL LOW (ref 21–57)
TIBC: 305 ug/dL (ref 236–444)
UIBC: 245 ug/dL (ref 120–384)

## 2019-08-18 LAB — FERRITIN: Ferritin: 37 ng/mL (ref 11–307)

## 2019-08-18 LAB — C-REACTIVE PROTEIN: CRP: 0.6 mg/dL (ref <1.0)

## 2019-08-18 MED ORDER — ASPIRIN EC 81 MG PO TBEC: 81.0000 mg | DELAYED_RELEASE_TABLET | Freq: Every day | ORAL | Status: AC

## 2019-08-18 NOTE — Assessment & Plan Note (Signed)
07/31/2016: hemoglobin 15.3, Platelet count 657  08/05/2019: Hemoglobin 14.4, platelet count 884  Differential diagnosis 1. Primary thrombocytosis: Related to myeloproliferative disorders of the bone marrow especially essential thrombocytosis and CML. I would like to send for BCR-ABL as well as JAK-2 dictation testings. Patient understands that JAK2 mutation is only present in 50% of essential thrombocytosis so the test is advantageous only if it is positive. If it is negative, it does not rule out. 2. Secondary/reactive thrombocytosis Different causes including infections, inflammation, iron deficiency.  I would like to send out for C-reactive protein, iron studies with ferritin to complete the workup.  Treatment options: 1. If it is primary essential thrombocytosis, treatment would depend on platelet count level as well as history of thrombosis. A. For low risk patients, (platelet counts less than 1000 and no history of blood clots) the treatment would be with aspirin therapy B. for high risk patients(platelet counts greater than 1000/history of blood clot) the treatment would be platelet lowering therapy with aspirin 2. Treatment of secondary thrombocytosis would be to treat underlying cause. There would not be any risk of thrombosis with secondary thrombocytosis.  Return to clinic in 2 weeks to discuss the results of these tests.

## 2019-08-20 ENCOUNTER — Ambulatory Visit: Payer: Medicare HMO | Admitting: Neurology

## 2019-08-20 ENCOUNTER — Other Ambulatory Visit: Payer: Self-pay

## 2019-08-20 ENCOUNTER — Encounter: Payer: Self-pay | Admitting: Neurology

## 2019-08-20 VITALS — BP 128/72 | HR 79 | Resp 20 | Ht 67.5 in | Wt 163.0 lb

## 2019-08-20 DIAGNOSIS — I679 Cerebrovascular disease, unspecified: Secondary | ICD-10-CM | POA: Diagnosis not present

## 2019-08-20 NOTE — Patient Instructions (Addendum)
Continue your medications as you are taking  Return to clinic in 1 year  Your provider has requested that you have labwork completed today. Please go to [facility] ([id number]) on the second floor of this building before leaving the office today. You do not need to check in. If you are not called within 15 minutes please check with the front desk.

## 2019-08-20 NOTE — Progress Notes (Signed)
Follow-up Visit   Date: 08/20/19    Andrea Landry MRN: [id number] DOB: 05-Jul-1940   Interim History: Andrea Landry is a 79 y.o. right-handed Caucasian female with GERD, hypertension, history of breast cancer s/p right mastectomy (1999) returning to the clinic for follow-up of TIA.  The patient was accompanied to the clinic by self.  History of present illness: Initial visit 08/16/2016:  She was at church on March 25th and while talking, she developed numbness over the right corner of her lips and 4th and 5th digit on the right. At the same time, she was unable to express words and unable to spell when typing. She was still able to comprehend what was being said.  Symptoms lasted 30-minutes and completely resolved, but then would recur in the same day.  She was having ongoing symptoms almost daily until April 8th, these stereotyped spells could occur 5-10 times per day, lasting anywhere from minutes to several hours.  On March 28th, she developed right cheek twitching which was constant for 3-days.  She went to the ER on 3/28 for these symptoms where MRI brain was ordered which did not show anything worrisome, specifically no sign of acute stroke or mass, there is scattered white matter changes. Of note, her CBC showed elevated platelet count of 647.  She followed up with her PCP who ordered US carotids which was normal and had repeat CBC with platelets 584. She was also started on aspirin 81mg  daily.   She did not have any new neurological symptoms in 2018 - 2020.  She is compliant with plavix and aspirin which she takes for intracranial stenosis (left distal M1 stenosis).  She completed dual antiplatelet therapy for 3 months, the continued plavix 75mg  daily.     UPDATE 08/20/2019:  She is here for follow-up visit and is doing well.  She denies any new neurological symptoms, no interval TIAs, falls, or hospitalizations.  She recently saw hematology for thrombocytosis, whose evaluation is in  progress and started on aspirin 81mg .  She has been compliant with plavix 75mg  daily which she takes for secondary stroke prevention.    Medications:  Current Outpatient Medications on File Prior to Visit  Medication Sig Dispense Refill  . amLODipine (NORVASC) 5 MG tablet Take 5 mg by mouth daily.   6  . Ascorbic Acid (VITAMIN C) 100 MG tablet Take 500 mg by mouth daily.    Marland Kitchen aspirin EC 81 MG tablet Take 1 tablet (81 mg total) by mouth daily.    Marland Kitchen atorvastatin (LIPITOR) 20 MG tablet Take 1 tablet (20 mg total) by mouth daily. 90 tablet 3  . cholecalciferol (VITAMIN D) 1000 units tablet Take 1 tablet by mouth daily    . clonazePAM (KLONOPIN) 0.5 MG tablet Take 1 tablet by mouth daily as needed for anxiety or sleep    . clopidogrel (PLAVIX) 75 MG tablet Take 1 tablet (75 mg total) by mouth daily. 90 tablet 3  . diphenhydrAMINE (BENADRYL) 25 MG tablet Take 25 mg by mouth at bedtime.     Marland Kitchen loratadine (CLARITIN) 10 MG tablet Take 10 mg by mouth daily.    . metoprolol succinate (TOPROL-XL) 25 MG 24 hr tablet Take 25 mg by mouth daily.     . Multiple Vitamins-Minerals (MULTIVITAMIN ADULT) TABS Take 1 tablet by mouth daily    . pantoprazole (PROTONIX) 40 MG tablet Take 40 mg by mouth daily.   11  . triamterene-hydrochlorothiazide (MAXZIDE-25) 37.5-25 MG tablet Take 0.5  tablets by mouth daily.      No current facility-administered medications on file prior to visit.    Allergies:  Allergies  Allergen Reactions  . Lisinopril Swelling    Vital Signs:  BP 128/72   Pulse 79   Resp 20   Ht 5' 7.5" (1.715 m)   Wt 163 lb (73.9 kg)   LMP 01/11/1996   SpO2 95%   BMI 25.15 kg/m    Neurological Exam: MENTAL STATUS including orientation to time, place, person, recent and remote memory, attention span and concentration, language, and fund of knowledge is normal.  Speech is not dysarthric.  CRANIAL NERVES: Pupils equal round and reactive to light.  Normal conjugate, extra-ocular eye movements in  all directions of gaze.  No ptosis.  Normal facial sensation.  Face is symmetric. Palate elevates symmetrically.  Tongue is midline.  MOTOR:  Motor strength is 5/5 in all extremities.  No pronator drift.   SENSORY:  Intact to light touch and vibration.  COORDINATION/GAIT:   Gait narrow based and stable.    Data: US carotids 08/13/2016:  No significant stenosis  MRI brain wo contrast 07/31/2016:  Mild to moderate chronic microvascular ischemic changes in the white matter. No acute intracranial abnormality.  Mild mucosal edema in the paranasal sinuses.  Routine EEG 08/16/2016: normal  CTA head 09/10/2016:  1. Critical distal left M1 segment stenosis. 2. Atheromatous type irregularity of left M2 branches. Elsewhere, intracranial vessels are unremarkable. 3. 4 cm thoracic inlet mass on a single slice through the chest for contrast timing. This is likely goiter or thyroid nodule, recommend sonography for confirmation.  MRA head 08/30/2016:   1. Abrupt signal loss of the distal M1 segment of the left MCA is favored to be artifactual, as the distal branches are normal. However, a high-grade stenosis would be difficult to exclude. CTA of the brain should be considered for definitive characterization. 2. Otherwise normal intracranial MRA.  US carotids 06/29/2018:  No significant stenosis   IMPRESSION/PLAN: TIA manifesting with expressive aphasia, R arm and face paresthesias due to critical distal M1 stenosis (07/2016).  She was managed with dual antiplatelet therapy and statin therapy.  Clinically, she has been doing great without any recurrence neurological events.  - Continue secondary stroke prevent with plavix 75mg    - She is also taking aspirin 81mg  for thrombocytosis, recommended by hematology  - PCP following lipid panel and BP which is well-controlled, continue atorvastatin 20mg  daily  - Low threshold for cerebral angiogram, if she develops any new symptoms  Return to clinic in 1  year  Thank you for allowing me to participate in patient's care.  If I can answer any additional questions, I would be pleased to do so.    Sincerely,    Lilley Hubble K. Posey Pronto, DO

## 2019-08-26 ENCOUNTER — Encounter: Payer: Self-pay | Admitting: Hematology and Oncology

## 2019-08-27 ENCOUNTER — Telehealth: Payer: Self-pay | Admitting: Hematology and Oncology

## 2019-08-27 NOTE — Telephone Encounter (Signed)
Scheduled per los, patient has been called and voicemail was left. 

## 2019-08-31 NOTE — Progress Notes (Signed)
Patient Care Team: Josetta Huddle, MD as PCP - General (Internal Medicine) Alda Berthold, DO as Consulting Physician (Neurology)  DIAGNOSIS:    ICD-10-CM   1. Essential thrombocytosis (HCC)  D47.3      CHIEF COMPLIANT: Follow-up of thrombocytosis   INTERVAL HISTORY: Andrea Landry is a 79 y.o. with above-mentioned history of thrombocytosis. Labs on 08/18/19 showed WBC 10.9, ANC 7.8, platelets 879, iron saturation 20%, ferritin 37, CRP 0.6. She presents to the clinic today to review her labs and discuss treatment.    ALLERGIES:  is allergic to lisinopril.  MEDICATIONS:  Current Outpatient Medications  Medication Sig Dispense Refill  . amLODipine (NORVASC) 5 MG tablet Take 5 mg by mouth daily.   6  . Ascorbic Acid (VITAMIN C) 100 MG tablet Take 500 mg by mouth daily.    Marland Kitchen aspirin EC 81 MG tablet Take 1 tablet (81 mg total) by mouth daily.    Marland Kitchen atorvastatin (LIPITOR) 20 MG tablet Take 1 tablet (20 mg total) by mouth daily. 90 tablet 3  . cholecalciferol (VITAMIN D) 1000 units tablet Take 1 tablet by mouth daily    . clonazePAM (KLONOPIN) 0.5 MG tablet Take 1 tablet by mouth daily as needed for anxiety or sleep    . clopidogrel (PLAVIX) 75 MG tablet Take 1 tablet (75 mg total) by mouth daily. 90 tablet 3  . diphenhydrAMINE (BENADRYL) 25 MG tablet Take 25 mg by mouth at bedtime.     Marland Kitchen loratadine (CLARITIN) 10 MG tablet Take 10 mg by mouth daily.    . metoprolol succinate (TOPROL-XL) 25 MG 24 hr tablet Take 25 mg by mouth daily.     . Multiple Vitamins-Minerals (MULTIVITAMIN ADULT) TABS Take 1 tablet by mouth daily    . pantoprazole (PROTONIX) 40 MG tablet Take 40 mg by mouth daily.   11  . triamterene-hydrochlorothiazide (MAXZIDE-25) 37.5-25 MG tablet Take 0.5 tablets by mouth daily.      No current facility-administered medications for this visit.    PHYSICAL EXAMINATION: ECOG PERFORMANCE STATUS: 1 - Symptomatic but completely ambulatory  Vitals:   09/01/19 1013  BP: 134/70    Pulse: 81  Resp: 16  Temp: 98.2 F (36.8 C)  SpO2: 97%   Filed Weights   09/01/19 1013  Weight: 162 lb 4.8 oz (73.6 kg)    LABORATORY DATA:  I have reviewed the data as listed CMP Latest Ref Rng & Units 07/31/2016  Glucose 65 - 99 mg/dL 106(H)  BUN 6 - 20 mg/dL 17  Creatinine 0.44 - 1.00 mg/dL 0.80  Sodium 135 - 145 mmol/L 141  Potassium 3.5 - 5.1 mmol/L 3.9  Chloride 101 - 111 mmol/L 105  CO2 22 - 32 mmol/L 28  Calcium 8.9 - 10.3 mg/dL 9.6    Lab Results  Component Value Date   WBC 10.9 (H) 08/18/2019   HGB 12.3 08/18/2019   HCT 38.6 08/18/2019   MCV 93.9 08/18/2019   PLT 879 (H) 08/18/2019   NEUTROABS 7.8 (H) 08/18/2019    ASSESSMENT & PLAN:  Essential thrombocytosis ([place]) 07/31/2016: hemoglobin 15.3, Platelet count 657  08/05/2019: Hemoglobin 14.4, platelet count 884  JAK2 V 617 mutation: Positive  This confirms a diagnosis of essential thrombocythemia (low risk: Platelets less than thousand and no history of blood clots) Patients with JAK2 mutation are at high risk for thrombosis. The other rare adverse effect is transformation to myelofibrosis.  Treatment plan: 1. Aspirin 81 mg daily started April 2021 2.  Consideration  for platelet reducing therapy like anagrelide or hydroxyurea if the platelet count goes over 1000.  Return to clinic in 3 months for labs and follow-up    No orders of the defined types were placed in this encounter.  The patient has a good understanding of the overall plan. she agrees with it. she will call with any problems that may develop before the next visit here.  Total time spent: 30 mins including face to face time and time spent for planning, charting and coordination of care  Nicholas Lose, MD 09/01/2019  I, Cloyde Reams Dorshimer, am acting as scribe for Dr. Nicholas Lose.  I have reviewed the above documentation for accuracy and completeness, and I agree with the above.

## 2019-09-01 ENCOUNTER — Other Ambulatory Visit: Payer: Self-pay

## 2019-09-01 ENCOUNTER — Inpatient Hospital Stay: Payer: Medicare HMO | Admitting: Hematology and Oncology

## 2019-09-01 DIAGNOSIS — K219 Gastro-esophageal reflux disease without esophagitis: Secondary | ICD-10-CM | POA: Diagnosis not present

## 2019-09-01 DIAGNOSIS — Z8673 Personal history of transient ischemic attack (TIA), and cerebral infarction without residual deficits: Secondary | ICD-10-CM | POA: Diagnosis not present

## 2019-09-01 DIAGNOSIS — E785 Hyperlipidemia, unspecified: Secondary | ICD-10-CM | POA: Diagnosis not present

## 2019-09-01 DIAGNOSIS — I10 Essential (primary) hypertension: Secondary | ICD-10-CM | POA: Diagnosis not present

## 2019-09-01 DIAGNOSIS — D473 Essential (hemorrhagic) thrombocythemia: Secondary | ICD-10-CM | POA: Diagnosis not present

## 2019-09-01 DIAGNOSIS — Z87891 Personal history of nicotine dependence: Secondary | ICD-10-CM | POA: Diagnosis not present

## 2019-09-01 DIAGNOSIS — Z79899 Other long term (current) drug therapy: Secondary | ICD-10-CM | POA: Diagnosis not present

## 2019-09-01 DIAGNOSIS — M8580 Other specified disorders of bone density and structure, unspecified site: Secondary | ICD-10-CM | POA: Diagnosis not present

## 2019-09-01 DIAGNOSIS — R7989 Other specified abnormal findings of blood chemistry: Secondary | ICD-10-CM | POA: Diagnosis not present

## 2019-09-01 DIAGNOSIS — Z853 Personal history of malignant neoplasm of breast: Secondary | ICD-10-CM | POA: Diagnosis not present

## 2019-09-01 NOTE — Assessment & Plan Note (Addendum)
07/31/2016: hemoglobin 15.3, Platelet count 657  08/05/2019: Hemoglobin 14.4, platelet count 884  JAK2 V 617 mutation: Positive  This confirms a diagnosis of essential thrombocythemia (low risk: Platelets less than thousand and no history of blood clots) Patients with JAK2 mutation are at high risk for thrombosis.  Treatment plan: 1. Aspirin 2.  Consideration for platelet reducing therapy like anagrelide or hydroxyurea.  Return to clinic in 3 months for labs and follow-up

## 2019-09-03 ENCOUNTER — Telehealth: Payer: Self-pay | Admitting: Hematology and Oncology

## 2019-09-03 NOTE — Telephone Encounter (Signed)
Scheduled per 04/28 los, patient has been called and voicemail was left.

## 2019-09-13 DIAGNOSIS — E785 Hyperlipidemia, unspecified: Secondary | ICD-10-CM | POA: Diagnosis not present

## 2019-09-13 DIAGNOSIS — I10 Essential (primary) hypertension: Secondary | ICD-10-CM | POA: Diagnosis not present

## 2019-09-13 DIAGNOSIS — J449 Chronic obstructive pulmonary disease, unspecified: Secondary | ICD-10-CM | POA: Diagnosis not present

## 2019-09-13 DIAGNOSIS — M1990 Unspecified osteoarthritis, unspecified site: Secondary | ICD-10-CM | POA: Diagnosis not present

## 2019-09-13 DIAGNOSIS — M179 Osteoarthritis of knee, unspecified: Secondary | ICD-10-CM | POA: Diagnosis not present

## 2019-10-14 LAB — JAK2 (INCLUDING V617F AND EXON 12), MPL,& CALR W/RFL MPN PANEL (NGS)

## 2019-12-01 ENCOUNTER — Inpatient Hospital Stay: Payer: Medicare HMO | Attending: Hematology and Oncology

## 2019-12-01 ENCOUNTER — Other Ambulatory Visit: Payer: Self-pay

## 2019-12-01 DIAGNOSIS — D473 Essential (hemorrhagic) thrombocythemia: Secondary | ICD-10-CM

## 2019-12-01 DIAGNOSIS — R7989 Other specified abnormal findings of blood chemistry: Secondary | ICD-10-CM | POA: Insufficient documentation

## 2019-12-01 LAB — CBC WITH DIFFERENTIAL (CANCER CENTER ONLY)
Abs Immature Granulocytes: 0.05 10*3/uL (ref 0.00–0.07)
Basophils Absolute: 0.2 10*3/uL — ABNORMAL HIGH (ref 0.0–0.1)
Basophils Relative: 1 %
Eosinophils Absolute: 0.3 10*3/uL (ref 0.0–0.5)
Eosinophils Relative: 3 %
HCT: 48.1 % — ABNORMAL HIGH (ref 36.0–46.0)
Hemoglobin: 15.5 g/dL — ABNORMAL HIGH (ref 12.0–15.0)
Immature Granulocytes: 1 %
Lymphocytes Relative: 15 %
Lymphs Abs: 1.7 10*3/uL (ref 0.7–4.0)
MCH: 27.7 pg (ref 26.0–34.0)
MCHC: 32.2 g/dL (ref 30.0–36.0)
MCV: 85.9 fL (ref 80.0–100.0)
Monocytes Absolute: 1 10*3/uL (ref 0.1–1.0)
Monocytes Relative: 9 %
Neutro Abs: 7.8 10*3/uL — ABNORMAL HIGH (ref 1.7–7.7)
Neutrophils Relative %: 71 %
Platelet Count: 890 10*3/uL — ABNORMAL HIGH (ref 150–400)
RBC: 5.6 MIL/uL — ABNORMAL HIGH (ref 3.87–5.11)
RDW: 14.9 % (ref 11.5–15.5)
WBC Count: 10.9 10*3/uL — ABNORMAL HIGH (ref 4.0–10.5)
nRBC: 0 % (ref 0.0–0.2)

## 2019-12-06 ENCOUNTER — Ambulatory Visit: Payer: Medicare HMO | Admitting: Cardiology

## 2020-01-07 ENCOUNTER — Ambulatory Visit: Payer: Medicare HMO | Admitting: Cardiology

## 2020-01-13 ENCOUNTER — Other Ambulatory Visit: Payer: Self-pay

## 2020-01-13 ENCOUNTER — Encounter: Payer: Self-pay | Admitting: Cardiology

## 2020-01-13 ENCOUNTER — Ambulatory Visit: Payer: Medicare HMO | Admitting: Cardiology

## 2020-01-13 VITALS — BP 141/86 | HR 70 | Resp 17 | Ht 67.0 in | Wt 160.0 lb

## 2020-01-13 DIAGNOSIS — I10 Essential (primary) hypertension: Secondary | ICD-10-CM | POA: Diagnosis not present

## 2020-01-13 DIAGNOSIS — E7800 Pure hypercholesterolemia, unspecified: Secondary | ICD-10-CM | POA: Diagnosis not present

## 2020-01-13 DIAGNOSIS — R0609 Other forms of dyspnea: Secondary | ICD-10-CM

## 2020-01-13 DIAGNOSIS — Z8673 Personal history of transient ischemic attack (TIA), and cerebral infarction without residual deficits: Secondary | ICD-10-CM | POA: Diagnosis not present

## 2020-01-13 MED ORDER — AMLODIPINE BESYLATE-VALSARTAN 5-160 MG PO TABS: 1.0000 | ORAL_TABLET | Freq: Every evening | ORAL | 2 refills | Status: DC

## 2020-01-13 NOTE — Progress Notes (Signed)
Primary Physician/Referring:  Josetta Huddle, MD  Patient ID: Andrea Landry, female    DOB: 1941-04-03, 79 y.o.   MRN: [id number]  Chief Complaint  Patient presents with  . Shortness of Breath    DOE  . Follow-up   HPI:    Andrea Landry  is a 79 y.o.  fairly active Caucasian female with a medical history significant for tobacco use disorder with a 30-pack-year history quit about 20 years ago, mild COPD, hyperlipidemia, hypertension, history of TIA in April 2018 when she presented with right facial numbness and right arm numbness and weakness, with complete resolution.  She also has history of right mastectomy for breast cancer in the remote past.  She is here on a 6 month OV for dyspnea and f/u of abnormal stress test.  She has a mildly abnormal stress test on 03/19/2018 revealing possible anterior ischemia versus soft shortening ratio with normal LVEF with intermediate risk.  She is fairly active and plays golf twice a week and at least tries to exercise aerobically at home 2-3 times a week.  She is presently doing well and has not had any change in symptoms.  No chest pain.  No dizziness or palpitations.  Past Medical History:  Diagnosis Date  . Abnormal nuclear stress test   . Cancer (HCC)    BREAST CANCER  . DOE (dyspnea on exertion)   . GERD (gastroesophageal reflux disease)   . Hyperlipidemia   . Hypertension   . Osteopenia 03/2014   T score -1.4 FRAX 10%/1.8%. No change from prior DEXA  . SOB (shortness of breath)   . TIA (transient ischemic attack)    Past Surgical History:  Procedure Laterality Date  . BREAST ENHANCEMENT SURGERY     FOLLOWING MASTECTOMY FOR BREAST CANCER  . BREAST SURGERY     MULTIPLE BREAST BIOPSIES,   . MASTECTOMY     RIGHT BREAST  . right hammer toe and Bunion  2013   Family History  Problem Relation Age of Onset  . Cancer Mother        RENAL  . Other Father   . Cerebral palsy Daughter   . Healthy Son     Social History   Tobacco Use   . Smoking status: Former Smoker    Packs/day: 1.00    Years: 40.00    Pack years: 40.00    Quit date: 1990    Years since quitting: 31.7  . Smokeless tobacco: Never Used  Substance Use Topics  . Alcohol use: Yes    Alcohol/week: 10.0 standard drinks    Types: 10 Standard drinks or equivalent per week    Comment: 1-2 glasses per night   Marital Status: Married  ROS  Review of Systems  Cardiovascular: Positive for dyspnea on exertion. Negative for chest pain and leg swelling.  Gastrointestinal: Negative for melena.   Objective  Blood pressure (!) 141/86, pulse 70, resp. rate 17, height 5\' 7"  (1.702 m), weight 160 lb (72.6 kg), last menstrual period 01/11/1996, SpO2 93 %.  Vitals with BMI 01/13/2020 09/01/2019 08/20/2019  Height 5\' 7"  5' 7.5" 5' 7.5"  Weight 160 lbs 162 lbs 5 oz 163 lbs  BMI 25.05 40.98 11.91  Systolic 478 295 621  Diastolic 86 70 72  Pulse 70 81 79     Physical Exam Cardiovascular:     Rate and Rhythm: Normal rate and regular rhythm.     Pulses: Normal pulses and intact distal pulses.  Heart sounds: Normal heart sounds. No murmur heard.  No gallop.      Comments: No leg edema, no JVD. Pulmonary:     Effort: Pulmonary effort is normal.     Breath sounds: Normal breath sounds.  Abdominal:     General: Bowel sounds are normal.     Palpations: Abdomen is soft.    Laboratory examination:   CBC Latest Ref Rng & Units 12/01/2019 08/18/2019 07/31/2016  WBC 4.0 - 10.5 K/uL 10.9(H) 10.9(H) 10.8(H)  Hemoglobin 12.0 - 15.0 g/dL 15.5(H) 12.3 15.3(H)  Hematocrit 36 - 46 % 48.1(H) 38.6 46.1(H)  Platelets 150 - 400 K/uL 890(H) 879(H) 657(H)   External labs:   Cholesterol, total 134.000 08/04/2019 HDL 53.000 08/04/2019 LDL-C 49.000 08/04/2019 Triglycerides 193.000 08/04/2019  TSH 0.700 08/04/2019  Hemoglobin 15.500 12/01/2019 Platelets 890.000 12/01/2019  Creatinine, Serum 0.960 08/04/2019 Potassium 4.400 08/04/2019 ALT (SGPT) 22.000 08/04/2019   Medications and  allergies   Allergies  Allergen Reactions  . Lisinopril Swelling     Outpatient Medications Prior to Visit  Medication Sig Dispense Refill  . Ascorbic Acid (VITAMIN C) 100 MG tablet Take 500 mg by mouth daily.    Marland Kitchen aspirin EC 81 MG tablet Take 1 tablet (81 mg total) by mouth daily.    Marland Kitchen atorvastatin (LIPITOR) 20 MG tablet Take 1 tablet (20 mg total) by mouth daily. 90 tablet 3  . cholecalciferol (VITAMIN D) 1000 units tablet Take 1 tablet by mouth daily    . clonazePAM (KLONOPIN) 0.5 MG tablet Take 1 tablet by mouth daily as needed for anxiety or sleep    . clopidogrel (PLAVIX) 75 MG tablet Take 1 tablet (75 mg total) by mouth daily. 90 tablet 3  . diphenhydrAMINE (BENADRYL) 25 MG tablet Take 25 mg by mouth at bedtime.     Marland Kitchen loratadine (CLARITIN) 10 MG tablet Take 10 mg by mouth daily.    . metoprolol succinate (TOPROL-XL) 25 MG 24 hr tablet Take 25 mg by mouth daily.     . Multiple Vitamins-Minerals (MULTIVITAMIN ADULT) TABS Take 1 tablet by mouth daily    . pantoprazole (PROTONIX) 40 MG tablet Take 40 mg by mouth daily.   11  . triamterene-hydrochlorothiazide (MAXZIDE-25) 37.5-25 MG tablet Take 0.5 tablets by mouth daily.     Marland Kitchen amLODipine (NORVASC) 5 MG tablet Take 5 mg by mouth daily.   6   No facility-administered medications prior to visit.   Radiology:   Chest x-ray 02/09/2018: The heart size and mediastinal contours are within normal limits. Both lungs are clear. No pneumothorax or pleural effusion is noted. The visualized skeletal structures are unremarkable.  Cardiac Studies:    PFT 02/24/2018: Conclusions: The diffusion defect is consistent with a pulmonary vascular process. Anemia cannot be excluded as a potential cause of the diffusion defect without correcting the observed diffusing capacity for hemoglobin.  Echocardiogram 04/22/2018: Left ventricle cavity is normal in size. Mild concentric hypertrophy of the left ventricle. Normal global wall motion. Doppler  evidence of grade II (pseudonormal) diastolic dysfunction. Diastolic dysfunction findings suggests elevated LA/LV end diastolic pressure. Calculated EF 61%. Left atrial cavity is mildly dilated in the apical views. Mild (Grade I) mitral regurgitation. Mild calcification of the mitral valve annulus. Trace tricuspid regurgitation. Unable to estimate PA pressure due to absence/minimal TR signal. IVC is dilated with poor inspiration collapse consistent with elevated right atrial pressure.   Lexiscan myoview stress test at [facility] 03/19/2018: 1. Moderate perfusion defect in the anterior septal wall with  differential including mild reversible ischemia versus variable breast attenuation. No wall motion abnormality favors attenuation artifact. 2. Normal left ventricular wall motion. Left ventricular ejection fraction 62% 3. Non invasive risk stratification*: Intermediate  Carotid artery duplex 06/29/2018: No hemodynamically significant arterial disease in the internal carotid artery bilaterally. Minimal plaque noted. Antegrade right vertebral artery flow. Antegrade left vertebral artery flow.  EKG  EKG 01/13/2020: Normal sinus rhythm at rate of 69 bpm, normal axis, no evident ischemia.  Normal EKG.      Assessment     ICD-10-CM   1. Dyspnea on exertion  R06.00 EKG 12-Lead    CBC  2. H/O TIA (transient ischemic attack) and stroke  Z86.73   3. Pure hypercholesterolemia  E78.00   4. Essential hypertension  I10 amLODipine-valsartan (EXFORGE) 5-160 MG tablet    Basic metabolic panel     Medications Discontinued During This Encounter  Medication Reason  . amLODipine (NORVASC) 5 MG tablet Change in therapy    Meds ordered this encounter  Medications  . amLODipine-valsartan (EXFORGE) 5-160 MG tablet    Sig: Take 1 tablet by mouth every evening.    Dispense:  30 tablet    Refill:  2    Discontinue Amlodipine. Okay to split the Rx drug in two if insurance does not cover.    Recommendations:   SALLEY BOXLEY is a 79 y.o. fairly active Caucasian female with a medical history significant for tobacco use disorder with a 30-pack-year history quit about 20 years ago, mild COPD, hyperlipidemia, hypertension, history of TIA in April 2018 when she presented with right facial numbness and right arm numbness and weakness, with complete resolution.  She also has history of right mastectomy for breast cancer without RT in the remote past, not diagnosed with essential thrombocytosis with elevated platelets, has been started on aspirin along with patient being previously on Plavix for TIA.  She is here on a 6 month OV for dyspnea and f/u of abnormal stress test.  She has a mildly abnormal stress test on 03/19/2018 revealing possible anterior ischemia versus soft shortening ratio with normal LVEF with intermediate risk.  As she is not a diabetic and she has never had chest pain, suspect her shortness of breath is probably related to hypertension.  She has continued to remain fairly active and continues to exercise regularly without any significant limitations.  Discontinue Imdur repeat and switch her to Exforge 5/160 mg daily, will check BMP in 2 weeks.  She has now been diagnosed with thrombocytosis, will recheck CBC as well.  I will like to see her back in 6 weeks and if she remains stable I will see her back on a as needed basis and can let Dr. Josetta Huddle manage her hypertension and dyspnea going forward.  Patient does have history of angioedema with lisinopril, patient was made aware of the potential side effect of valsartan and when to seek help.   Adrian Prows, MD, [facility] 01/13/2020, 11:20 AM Office: [phone]

## 2020-01-25 DIAGNOSIS — H43813 Vitreous degeneration, bilateral: Secondary | ICD-10-CM | POA: Diagnosis not present

## 2020-01-25 DIAGNOSIS — H35373 Puckering of macula, bilateral: Secondary | ICD-10-CM | POA: Diagnosis not present

## 2020-01-25 DIAGNOSIS — H35341 Macular cyst, hole, or pseudohole, right eye: Secondary | ICD-10-CM | POA: Diagnosis not present

## 2020-02-02 DIAGNOSIS — R0600 Dyspnea, unspecified: Secondary | ICD-10-CM | POA: Diagnosis not present

## 2020-02-02 DIAGNOSIS — M179 Osteoarthritis of knee, unspecified: Secondary | ICD-10-CM | POA: Diagnosis not present

## 2020-02-02 DIAGNOSIS — E785 Hyperlipidemia, unspecified: Secondary | ICD-10-CM | POA: Diagnosis not present

## 2020-02-02 DIAGNOSIS — Z23 Encounter for immunization: Secondary | ICD-10-CM | POA: Diagnosis not present

## 2020-02-02 DIAGNOSIS — I10 Essential (primary) hypertension: Secondary | ICD-10-CM | POA: Diagnosis not present

## 2020-02-02 DIAGNOSIS — M1990 Unspecified osteoarthritis, unspecified site: Secondary | ICD-10-CM | POA: Diagnosis not present

## 2020-02-02 DIAGNOSIS — J449 Chronic obstructive pulmonary disease, unspecified: Secondary | ICD-10-CM | POA: Diagnosis not present

## 2020-02-03 LAB — BASIC METABOLIC PANEL
BUN/Creatinine Ratio: 25 (ref 12–28)
BUN: 19 mg/dL (ref 8–27)
CO2: 26 mmol/L (ref 20–29)
Calcium: 9.5 mg/dL (ref 8.7–10.3)
Chloride: 103 mmol/L (ref 96–106)
Creatinine, Ser: 0.75 mg/dL (ref 0.57–1.00)
GFR calc Af Amer: 88 mL/min/{1.73_m2} (ref >59)
GFR calc non Af Amer: 77 mL/min/{1.73_m2} (ref >59)
Glucose: 77 mg/dL (ref 65–99)
Potassium: 4.8 mmol/L (ref 3.5–5.2)
Sodium: 141 mmol/L (ref 134–144)

## 2020-02-03 LAB — CBC
Hematocrit: 39.9 % (ref 34.0–46.6)
Hemoglobin: 15.1 g/dL (ref 11.1–15.9)
MCH: 35 pg — ABNORMAL HIGH (ref 26.6–33.0)
MCHC: 37.8 g/dL — ABNORMAL HIGH (ref 31.5–35.7)
MCV: 93 fL (ref 79–97)
Platelets: 927 10*3/uL (ref 150–450)
RBC: 4.31 x10E6/uL (ref 3.77–5.28)
RDW: 20.3 % — ABNORMAL HIGH (ref 11.7–15.4)
WBC: 10.4 10*3/uL (ref 3.4–10.8)

## 2020-02-04 ENCOUNTER — Telehealth: Payer: Self-pay | Admitting: Hematology and Oncology

## 2020-02-04 NOTE — Telephone Encounter (Signed)
Called pt per 10/1 sch msg - left message with appt date and time

## 2020-02-07 NOTE — Progress Notes (Signed)
Patient Care Team: Josetta Huddle, MD as PCP - General (Internal Medicine) Alda Berthold, DO as Consulting Physician (Neurology)  DIAGNOSIS:    ICD-10-CM   1. Essential thrombocytosis (HCC)  D47.3     CHIEF COMPLIANT: Follow-up of thrombocytosis  INTERVAL HISTORY: Andrea Landry is a 79 y.o. with above-mentioned history of thrombocytosis. Labs on 02/02/20 showed WBC 10.4, Hg 15.1, platelets 927. She presents to the clinic today for follow-up.  She is moving to [place] to be in assisted living facility.  She is still planning to come back to [facility] for all her activities.  She stays active with golf at [facility] [employer].  ALLERGIES:  is allergic to lisinopril.  MEDICATIONS:  Current Outpatient Medications  Medication Sig Dispense Refill  . amLODipine-valsartan (EXFORGE) 5-160 MG tablet Take 1 tablet by mouth every evening. 30 tablet 2  . Ascorbic Acid (VITAMIN C) 100 MG tablet Take 500 mg by mouth daily.    Marland Kitchen aspirin EC 81 MG tablet Take 1 tablet (81 mg total) by mouth daily.    Marland Kitchen atorvastatin (LIPITOR) 20 MG tablet Take 1 tablet (20 mg total) by mouth daily. 90 tablet 3  . cholecalciferol (VITAMIN D) 1000 units tablet Take 1 tablet by mouth daily    . clonazePAM (KLONOPIN) 0.5 MG tablet Take 1 tablet by mouth daily as needed for anxiety or sleep    . clopidogrel (PLAVIX) 75 MG tablet Take 1 tablet (75 mg total) by mouth daily. 90 tablet 3  . diphenhydrAMINE (BENADRYL) 25 MG tablet Take 25 mg by mouth at bedtime.     Marland Kitchen loratadine (CLARITIN) 10 MG tablet Take 10 mg by mouth daily.    . metoprolol succinate (TOPROL-XL) 25 MG 24 hr tablet Take 25 mg by mouth daily.     . Multiple Vitamins-Minerals (MULTIVITAMIN ADULT) TABS Take 1 tablet by mouth daily    . pantoprazole (PROTONIX) 40 MG tablet Take 40 mg by mouth daily.   11  . triamterene-hydrochlorothiazide (MAXZIDE-25) 37.5-25 MG tablet Take 0.5 tablets by mouth daily.      No current facility-administered medications for this  visit.    PHYSICAL EXAMINATION: ECOG PERFORMANCE STATUS: 1 - Symptomatic but completely ambulatory  Vitals:   02/08/20 1446  BP: (!) 132/58  Pulse: 82  Resp: 18  Temp: 97.8 F (36.6 C)  SpO2: 94%   Filed Weights   02/08/20 1446  Weight: 161 lb 8 oz (73.3 kg)    LABORATORY DATA:  I have reviewed the data as listed CMP Latest Ref Rng & Units 02/02/2020 07/31/2016  Glucose 65 - 99 mg/dL 77 106(H)  BUN 8 - 27 mg/dL 19 17  Creatinine 0.57 - 1.00 mg/dL 0.75 0.80  Sodium 134 - 144 mmol/L 141 141  Potassium 3.5 - 5.2 mmol/L 4.8 3.9  Chloride 96 - 106 mmol/L 103 105  CO2 20 - 29 mmol/L 26 28  Calcium 8.7 - 10.3 mg/dL 9.5 9.6    Lab Results  Component Value Date   WBC 10.4 02/02/2020   HGB 15.1 02/02/2020   HCT 39.9 02/02/2020   MCV 93 02/02/2020   PLT 927 (HH) 02/02/2020   NEUTROABS 7.8 (H) 12/01/2019    ASSESSMENT & PLAN:  Essential thrombocytosis ([place]) 07/31/2016: hemoglobin 15.3,Platelet count 657  08/05/2019: Hemoglobin 14.4, platelet count 884 02/02/2020: Hemoglobin 13.1, platelets 927  JAK2 V 617 mutation: Positive  This confirms a diagnosis of essential thrombocythemia (low risk: Platelets less than thousand and no history of blood clots) Patients  with JAK2 mutation are at high risk for thrombosis. The other rare adverse effect is transformation to myelofibrosis.  Treatment plan: 1. Aspirin 81 mg daily started April 2021 2.  because the platelet count has climbed up to 927, we discussed the pros and cons of starting her on platelet reduction therapy.  I recommended anagrelide.  She will start at 0.5 milligram twice a day. Return to clinic in 4 weeks with labs and follow-up   No orders of the defined types were placed in this encounter.  The patient has a good understanding of the overall plan. she agrees with it. she will call with any problems that may develop before the next visit here.  Total time spent: 30 mins including face to face time and time spent  for planning, charting and coordination of care  Nicholas Lose, MD 02/08/2020  I, Cloyde Reams Dorshimer, am acting as scribe for Dr. Nicholas Lose.  I have reviewed the above documentation for accuracy and completeness, and I agree with the above.

## 2020-02-08 ENCOUNTER — Inpatient Hospital Stay: Payer: Medicare HMO | Attending: Hematology and Oncology | Admitting: Hematology and Oncology

## 2020-02-08 ENCOUNTER — Other Ambulatory Visit: Payer: Self-pay

## 2020-02-08 DIAGNOSIS — Z7982 Long term (current) use of aspirin: Secondary | ICD-10-CM | POA: Insufficient documentation

## 2020-02-08 DIAGNOSIS — Z79899 Other long term (current) drug therapy: Secondary | ICD-10-CM | POA: Diagnosis not present

## 2020-02-08 DIAGNOSIS — D473 Essential (hemorrhagic) thrombocythemia: Secondary | ICD-10-CM | POA: Insufficient documentation

## 2020-02-08 MED ORDER — CETIRIZINE HCL 10 MG PO TABS: 10.0000 mg | ORAL_TABLET | Freq: Every day | ORAL | Status: DC

## 2020-02-08 MED ORDER — ANAGRELIDE HCL 0.5 MG PO CAPS: 0.5000 mg | ORAL_CAPSULE | Freq: Two times a day (BID) | ORAL | 1 refills | Status: DC

## 2020-02-08 NOTE — Assessment & Plan Note (Signed)
07/31/2016: hemoglobin 15.3,Platelet count 657  08/05/2019: Hemoglobin 14.4, platelet count 884 02/08/2020:  JAK2 V 617 mutation: Positive  This confirms a diagnosis of essential thrombocythemia (low risk: Platelets less than thousand and no history of blood clots) Patients with JAK2 mutation are at high risk for thrombosis. The other rare adverse effect is transformation to myelofibrosis.  Treatment plan: 1. Aspirin 81 mg daily started April 2021 2.  Consideration for platelet reducing therapy like anagrelide or hydroxyurea if the platelet count goes over 1000.  Return to clinic in 3 months for labs and follow-up

## 2020-02-09 ENCOUNTER — Other Ambulatory Visit: Payer: Self-pay | Admitting: Hematology and Oncology

## 2020-02-09 MED ORDER — HYDROXYUREA 500 MG PO CAPS: 500.0000 mg | ORAL_CAPSULE | Freq: Every day | ORAL | 3 refills | Status: DC

## 2020-02-09 NOTE — Progress Notes (Signed)
Patient called [place] to inform us that anagrelide was too expensive. Therefore we switched her treatment to hydroxyurea 500 mg daily.

## 2020-02-28 ENCOUNTER — Other Ambulatory Visit: Payer: Self-pay

## 2020-02-28 ENCOUNTER — Encounter: Payer: Self-pay | Admitting: Cardiology

## 2020-02-28 ENCOUNTER — Ambulatory Visit: Payer: Medicare HMO | Admitting: Cardiology

## 2020-02-28 VITALS — BP 125/72 | HR 75 | Resp 15 | Ht 67.0 in | Wt 161.0 lb

## 2020-02-28 DIAGNOSIS — I10 Essential (primary) hypertension: Secondary | ICD-10-CM

## 2020-02-28 DIAGNOSIS — D473 Essential (hemorrhagic) thrombocythemia: Secondary | ICD-10-CM | POA: Diagnosis not present

## 2020-02-28 MED ORDER — AMLODIPINE BESYLATE-VALSARTAN 5-160 MG PO TABS: 1.0000 | ORAL_TABLET | Freq: Every evening | ORAL | 1 refills | Status: DC

## 2020-02-28 NOTE — Progress Notes (Signed)
Primary Physician/Referring:  Andrea Huddle, MD  Patient ID: Andrea Landry, female    DOB: 05-25-1940, 79 y.o.   MRN: [id number]  Chief Complaint  Patient presents with  . Follow-up    6 week  . Hypertension   HPI:    Andrea Landry  is a 79 y.o.  fairly active Caucasian female with a medical history significant for tobacco use disorder with a 30-pack-year history quit about 20 years ago, mild COPD, hyperlipidemia, hypertension, history of TIA in April 2018 when she presented with right facial numbness and right arm numbness and weakness, with complete resolution.  She also has history of right mastectomy for breast cancer in the remote past.  She has a mildly abnormal stress test on 03/19/2018 revealing possible anterior ischemia versus soft shortening ratio with normal LVEF with intermediate risk.  She is fairly active and plays golf twice a week and at least tries to exercise aerobically at home 2-3 times a week.  She is presently doing well and remains asymptomatic and presents here for follow-up of hypertension.  Past Medical History:  Diagnosis Date  . Abnormal nuclear stress test   . Cancer (HCC)    BREAST CANCER  . DOE (dyspnea on exertion)   . GERD (gastroesophageal reflux disease)   . Hyperlipidemia   . Hypertension   . Osteopenia 03/2014   T score -1.4 FRAX 10%/1.8%. No change from prior DEXA  . SOB (shortness of breath)   . TIA (transient ischemic attack)    Past Surgical History:  Procedure Laterality Date  . BREAST ENHANCEMENT SURGERY     FOLLOWING MASTECTOMY FOR BREAST CANCER  . BREAST SURGERY     MULTIPLE BREAST BIOPSIES,   . MASTECTOMY     RIGHT BREAST  . right hammer toe and Bunion  2013   Family History  Problem Relation Age of Onset  . Cancer Mother        RENAL  . Other Father   . Cerebral palsy Daughter   . Healthy Son     Social History   Tobacco Use  . Smoking status: Former Smoker    Packs/day: 1.00    Years: 40.00    Pack years:  40.00    Quit date: 1990    Years since quitting: 31.8  . Smokeless tobacco: Never Used  Substance Use Topics  . Alcohol use: Yes    Alcohol/week: 10.0 standard drinks    Types: 10 Standard drinks or equivalent per week    Comment: 1-2 glasses per night   Marital Status: Married  ROS  Review of Systems  Cardiovascular: Positive for dyspnea on exertion (stable). Negative for chest pain and leg swelling.  Gastrointestinal: Negative for melena.   Objective  Blood pressure 125/72, pulse 75, resp. rate 15, height 5\' 7"  (1.702 m), weight 161 lb (73 kg), last menstrual period 01/11/1996, SpO2 96 %.  Vitals with BMI 02/28/2020 02/08/2020 01/13/2020  Height 5\' 7"  5\' 7"  5\' 7"   Weight 161 lbs 161 lbs 8 oz 160 lbs  BMI 25.21 75.10 25.85  Systolic 277 824 235  Diastolic 72 58 86  Pulse 75 82 70     Physical Exam Cardiovascular:     Rate and Rhythm: Normal rate and regular rhythm.     Pulses: Normal pulses and intact distal pulses.     Heart sounds: Normal heart sounds. No murmur heard.  No gallop.      Comments: No leg edema, no JVD. Pulmonary:  Effort: Pulmonary effort is normal.     Breath sounds: Normal breath sounds.  Abdominal:     General: Bowel sounds are normal.     Palpations: Abdomen is soft.    Laboratory examination:   CBC Latest Ref Rng & Units 02/02/2020 12/01/2019 08/18/2019  WBC 3.4 - 10.8 x10E3/uL 10.4 10.9(H) 10.9(H)  Hemoglobin 11.1 - 15.9 g/dL 15.1 15.5(H) 12.3  Hematocrit 34.0 - 46.6 % 39.9 48.1(H) 38.6  Platelets 150 - 450 x10E3/uL 927(HH) 890(H) 879(H)   BMP Latest Ref Rng & Units 02/02/2020 07/31/2016  Glucose 65 - 99 mg/dL 77 106(H)  BUN 8 - 27 mg/dL 19 17  Creatinine 0.57 - 1.00 mg/dL 0.75 0.80  BUN/Creat Ratio 12 - 28 25 -  Sodium 134 - 144 mmol/L 141 141  Potassium 3.5 - 5.2 mmol/L 4.8 3.9  Chloride 96 - 106 mmol/L 103 105  CO2 20 - 29 mmol/L 26 28  Calcium 8.7 - 10.3 mg/dL 9.5 9.6    External labs:   Cholesterol, total 134.000 08/04/2019 HDL  53.000 08/04/2019 LDL-C 49.000 08/04/2019 Triglycerides 193.000 08/04/2019  TSH 0.700 08/04/2019  Hemoglobin 15.500 12/01/2019 Platelets 890.000 12/01/2019  Creatinine, Serum 0.960 08/04/2019 Potassium 4.400 08/04/2019 ALT (SGPT) 22.000 08/04/2019  Medications and allergies   Allergies  Allergen Reactions  . Lisinopril Swelling    Outpatient Medications Prior to Visit  Medication Sig Dispense Refill  . Ascorbic Acid (VITAMIN C) 100 MG tablet Take 500 mg by mouth daily.    Marland Kitchen aspirin EC 81 MG tablet Take 1 tablet (81 mg total) by mouth daily.    Marland Kitchen atorvastatin (LIPITOR) 20 MG tablet Take 1 tablet (20 mg total) by mouth daily. 90 tablet 3  . cetirizine (ZYRTEC ALLERGY) 10 MG tablet Take 1 tablet (10 mg total) by mouth daily.    . cholecalciferol (VITAMIN D) 1000 units tablet Take 1 tablet by mouth daily    . clonazePAM (KLONOPIN) 0.5 MG tablet Take 1 tablet by mouth daily as needed for anxiety or sleep    . clopidogrel (PLAVIX) 75 MG tablet Take 1 tablet (75 mg total) by mouth daily. 90 tablet 3  . diphenhydrAMINE (BENADRYL) 25 MG tablet Take 25 mg by mouth at bedtime.     . hydroxyurea (HYDREA) 500 MG capsule Take 1 capsule (500 mg total) by mouth daily. May take with food to minimize GI side effects. 90 capsule 3  . metoprolol succinate (TOPROL-XL) 25 MG 24 hr tablet Take 25 mg by mouth daily.     . Multiple Vitamins-Minerals (MULTIVITAMIN ADULT) TABS Take 1 tablet by mouth daily    . pantoprazole (PROTONIX) 40 MG tablet Take 40 mg by mouth daily.   11  . triamterene-hydrochlorothiazide (MAXZIDE-25) 37.5-25 MG tablet Take 0.5 tablets by mouth daily.     Marland Kitchen amLODipine-valsartan (EXFORGE) 5-160 MG tablet Take 1 tablet by mouth every evening. 30 tablet 2   No facility-administered medications prior to visit.   Radiology:   Chest x-ray 02/09/2018: The heart size and mediastinal contours are within normal limits. Both lungs are clear. No pneumothorax or pleural effusion is noted. The  visualized skeletal structures are unremarkable.  Cardiac Studies:   PFT 02/24/2018: Conclusions: The diffusion defect is consistent with a pulmonary vascular process. Anemia cannot be excluded as a potential cause of the diffusion defect without correcting the observed diffusing capacity for hemoglobin.  Echocardiogram 04/22/2018: Left ventricle cavity is normal in size. Mild concentric hypertrophy of the left ventricle. Normal global wall motion. Doppler evidence  of grade II (pseudonormal) diastolic dysfunction. Diastolic dysfunction findings suggests elevated LA/LV end diastolic pressure. Calculated EF 61%. Left atrial cavity is mildly dilated in the apical views. Mild (Grade I) mitral regurgitation. Mild calcification of the mitral valve annulus. Trace tricuspid regurgitation. Unable to estimate PA pressure due to absence/minimal TR signal. IVC is dilated with poor inspiration collapse consistent with elevated right atrial pressure.   Lexiscan myoview stress test at [facility] 03/19/2018: 1. Moderate perfusion defect in the anterior septal wall with differential including mild reversible ischemia versus variable breast attenuation. No wall motion abnormality favors attenuation artifact. 2. Normal left ventricular wall motion. Left ventricular ejection fraction 62% 3. Non invasive risk stratification*: Intermediate  Carotid artery duplex 06/29/2018: No hemodynamically significant arterial disease in the internal carotid artery bilaterally. Minimal plaque noted. Antegrade right vertebral artery flow. Antegrade left vertebral artery flow.  EKG  EKG 01/13/2020: Normal sinus rhythm at rate of 69 bpm, normal axis, no evident ischemia.  Normal EKG.      Assessment     ICD-10-CM   1. Essential thrombocytosis (HCC)  D47.3   2. Essential hypertension  I10 amLODipine-valsartan (EXFORGE) 5-160 MG tablet     Medications Discontinued During This Encounter  Medication Reason  .  amLODipine-valsartan (EXFORGE) 5-160 MG tablet Reorder    Meds ordered this encounter  Medications  . amLODipine-valsartan (EXFORGE) 5-160 MG tablet    Sig: Take 1 tablet by mouth every evening.    Dispense:  90 tablet    Refill:  1    Discontinue Amlodipine. Okay to split the Rx drug in two if insurance does not cover.   Recommendations:   Andrea Landry is a 79 y.o. fairly active Caucasian female with a medical history significant for tobacco use disorder with a 30-pack-year history quit about 20 years ago, mild COPD, hyperlipidemia, hypertension, history of TIA in April 2018 when she presented with right facial numbness and right arm numbness and weakness, with complete resolution.  She also has history of right mastectomy for breast cancer without RT in the remote past, not diagnosed with essential thrombocytosis with elevated platelets, has been started on aspirin along with patient being previously on Plavix for TIA.  Since addition of valsartan, she has done well with excellent control of blood pressure.  I will combine valsartan and amlodipine together, refill the prescription.  She remains asymptomatic.  No change in physical exam.  She is now being evaluated further for essential thrombocytosis by Dr. Sonny Dandy.  I will see her back on a as needed basis.   Adrian Prows, MD, [facility] 02/28/2020, 10:48 AM Office: [phone]

## 2020-03-14 NOTE — Progress Notes (Signed)
Patient Care Team: Josetta Huddle, MD as PCP - General (Internal Medicine) Alda Berthold, DO as Consulting Physician (Neurology)  DIAGNOSIS:    ICD-10-CM   1. Essential thrombocytosis (HCC)  D47.3 CBC with Differential (Cancer Center Only)    CHIEF COMPLIANT: Follow-up ofthrombocytosis  INTERVAL HISTORY: Andrea Landry is a 79 y.o. with above-mentioned history of thrombocytosis currently on aspirin 81mg  daily and anagrelide.She presents to the clinic todayfor follow-up. Shes tolerating hydrea very well.  ALLERGIES:  is allergic to lisinopril.  MEDICATIONS:  Current Outpatient Medications  Medication Sig Dispense Refill  . amLODipine-valsartan (EXFORGE) 5-160 MG tablet Take 1 tablet by mouth every evening. 90 tablet 1  . Ascorbic Acid (VITAMIN C) 100 MG tablet Take 500 mg by mouth daily.    Marland Kitchen aspirin EC 81 MG tablet Take 1 tablet (81 mg total) by mouth daily.    Marland Kitchen atorvastatin (LIPITOR) 20 MG tablet Take 1 tablet (20 mg total) by mouth daily. 90 tablet 3  . cetirizine (ZYRTEC ALLERGY) 10 MG tablet Take 1 tablet (10 mg total) by mouth daily.    . cholecalciferol (VITAMIN D) 1000 units tablet Take 1 tablet by mouth daily    . clonazePAM (KLONOPIN) 0.5 MG tablet Take 1 tablet by mouth daily as needed for anxiety or sleep    . clopidogrel (PLAVIX) 75 MG tablet Take 1 tablet (75 mg total) by mouth daily. 90 tablet 3  . diphenhydrAMINE (BENADRYL) 25 MG tablet Take 25 mg by mouth at bedtime.     . hydroxyurea (HYDREA) 500 MG capsule Take 1 capsule (500 mg total) by mouth daily. May take with food to minimize GI side effects. 90 capsule 3  . metoprolol succinate (TOPROL-XL) 25 MG 24 hr tablet Take 25 mg by mouth daily.     . Multiple Vitamins-Minerals (MULTIVITAMIN ADULT) TABS Take 1 tablet by mouth daily    . pantoprazole (PROTONIX) 40 MG tablet Take 40 mg by mouth daily.   11  . triamterene-hydrochlorothiazide (MAXZIDE-25) 37.5-25 MG tablet Take 0.5 tablets by mouth daily.      No  current facility-administered medications for this visit.    PHYSICAL EXAMINATION: ECOG PERFORMANCE STATUS: 1 - Symptomatic but completely ambulatory  Vitals:   03/15/20 1552  BP: (!) 113/51  Pulse: 78  Resp: 17  Temp: 98.2 F (36.8 C)  SpO2: 94%   Filed Weights   03/15/20 1552  Weight: 159 lb 9.6 oz (72.4 kg)    LABORATORY DATA:  I have reviewed the data as listed CMP Latest Ref Rng & Units 02/02/2020 07/31/2016  Glucose 65 - 99 mg/dL 77 106(H)  BUN 8 - 27 mg/dL 19 17  Creatinine 0.57 - 1.00 mg/dL 0.75 0.80  Sodium 134 - 144 mmol/L 141 141  Potassium 3.5 - 5.2 mmol/L 4.8 3.9  Chloride 96 - 106 mmol/L 103 105  CO2 20 - 29 mmol/L 26 28  Calcium 8.7 - 10.3 mg/dL 9.5 9.6    Lab Results  Component Value Date   WBC 9.9 03/15/2020   HGB 12.2 03/15/2020   HCT 36.8 03/15/2020   MCV 93.6 03/15/2020   PLT 609 (H) 03/15/2020   NEUTROABS 7.2 03/15/2020    ASSESSMENT & PLAN:  Essential thrombocytosis ([place]) 07/31/2016: hemoglobin 15.3,Platelet count 657  08/05/2019: Hemoglobin 14.4, platelet count 884 02/02/2020: Hemoglobin 13.1, platelets 927 03/15/20: Hb 12.2, Pl 609  JAK2V 682mutation: Positive  This confirms a diagnosis of essential thrombocythemia (low risk: Platelets less than thousand and no history of  blood clots) Patients with JAK2 mutation are at high risk for thrombosis. The other rare adverse effect is transformation to myelofibrosis.  Treatment plan: 1.Aspirin81 mg daily started April 2021 2.hydroxyurea 500 mg daily  Responding to hydrea and tolerating it well. Return to clinic in 2 months with labs and follow-up    Orders Placed This Encounter  Procedures  . CBC with Differential (Cancer Center Only)    Standing Status:   Future    Standing Expiration Date:   03/15/2021   The patient has a good understanding of the overall plan. she agrees with it. she will call with any problems that may develop before the next visit here.  Total time spent:  30 mins including face to face time and time spent for planning, charting and coordination of care  Nicholas Lose, MD 03/15/2020  I, Cloyde Reams Dorshimer, am acting as scribe for Dr. Nicholas Lose.  I have reviewed the above documentation for accuracy and completeness, and I agree with the above.

## 2020-03-15 ENCOUNTER — Other Ambulatory Visit: Payer: Self-pay

## 2020-03-15 ENCOUNTER — Inpatient Hospital Stay: Payer: Medicare HMO

## 2020-03-15 ENCOUNTER — Inpatient Hospital Stay: Payer: Medicare HMO | Attending: Hematology and Oncology | Admitting: Hematology and Oncology

## 2020-03-15 DIAGNOSIS — D473 Essential (hemorrhagic) thrombocythemia: Secondary | ICD-10-CM

## 2020-03-15 DIAGNOSIS — Z7982 Long term (current) use of aspirin: Secondary | ICD-10-CM | POA: Insufficient documentation

## 2020-03-15 DIAGNOSIS — Z79899 Other long term (current) drug therapy: Secondary | ICD-10-CM | POA: Insufficient documentation

## 2020-03-15 LAB — CBC WITH DIFFERENTIAL (CANCER CENTER ONLY)
Abs Immature Granulocytes: 0.04 10*3/uL (ref 0.00–0.07)
Basophils Absolute: 0.1 10*3/uL (ref 0.0–0.1)
Basophils Relative: 1 %
Eosinophils Absolute: 0.2 10*3/uL (ref 0.0–0.5)
Eosinophils Relative: 2 %
HCT: 36.8 % (ref 36.0–46.0)
Hemoglobin: 12.2 g/dL (ref 12.0–15.0)
Immature Granulocytes: 0 %
Lymphocytes Relative: 16 %
Lymphs Abs: 1.6 10*3/uL (ref 0.7–4.0)
MCH: 31 pg (ref 26.0–34.0)
MCHC: 33.2 g/dL (ref 30.0–36.0)
MCV: 93.6 fL (ref 80.0–100.0)
Monocytes Absolute: 0.7 10*3/uL (ref 0.1–1.0)
Monocytes Relative: 8 %
Neutro Abs: 7.2 10*3/uL (ref 1.7–7.7)
Neutrophils Relative %: 73 %
Platelet Count: 609 10*3/uL — ABNORMAL HIGH (ref 150–400)
RBC: 3.93 MIL/uL (ref 3.87–5.11)
RDW: 18.1 % — ABNORMAL HIGH (ref 11.5–15.5)
WBC Count: 9.9 10*3/uL (ref 4.0–10.5)
nRBC: 0 % (ref 0.0–0.2)

## 2020-03-15 NOTE — Assessment & Plan Note (Signed)
07/31/2016: hemoglobin 15.3,Platelet count 657  08/05/2019: Hemoglobin 14.4, platelet count 884 02/02/2020: Hemoglobin 13.1, platelets 927  JAK2V 684mutation: Positive  This confirms a diagnosis of essential thrombocythemia (low risk: Platelets less than thousand and no history of blood clots) Patients with JAK2 mutation are at high risk for thrombosis. The other rare adverse effect is transformation to myelofibrosis.  Treatment plan: 1.Aspirin81 mg daily started April 2021 2.  hydroxyurea 5 mg daily  Lab review:  Return to clinic in 2 months with labs and follow-up

## 2020-05-14 NOTE — Progress Notes (Signed)
Patient Care Team: Josetta Huddle, MD as PCP - General (Internal Medicine) Alda Berthold, DO as Consulting Physician (Neurology)  DIAGNOSIS:    ICD-10-CM   1. Essential thrombocytosis (HCC)  D47.3 CBC with Differential (Cancer Center Only)    CHIEF COMPLIANT: Follow-up ofthrombocytosis  INTERVAL HISTORY: Andrea Landry is a 80 y.o. with above-mentioned history of thrombocytosis currently on aspirin 81mg  daily and anagrelide.She presents to the clinic todayfor follow-up.  She is tolerating Hydrea extremely well without any problems or concerns.  Denies any nausea vomiting or hair loss or GI disturbances.  ALLERGIES:  is allergic to lisinopril.  MEDICATIONS:  Current Outpatient Medications  Medication Sig Dispense Refill   amLODipine-valsartan (EXFORGE) 5-160 MG tablet Take 1 tablet by mouth every evening. 90 tablet 1   Ascorbic Acid (VITAMIN C) 100 MG tablet Take 500 mg by mouth daily.     aspirin EC 81 MG tablet Take 1 tablet (81 mg total) by mouth daily.     atorvastatin (LIPITOR) 20 MG tablet Take 1 tablet (20 mg total) by mouth daily. 90 tablet 3   cetirizine (ZYRTEC ALLERGY) 10 MG tablet Take 1 tablet (10 mg total) by mouth daily.     cholecalciferol (VITAMIN D) 1000 units tablet Take 1 tablet by mouth daily     clonazePAM (KLONOPIN) 0.5 MG tablet Take 1 tablet by mouth daily as needed for anxiety or sleep     clopidogrel (PLAVIX) 75 MG tablet Take 1 tablet (75 mg total) by mouth daily. 90 tablet 3   diphenhydrAMINE (BENADRYL) 25 MG tablet Take 25 mg by mouth at bedtime.      hydroxyurea (HYDREA) 500 MG capsule Take 1 capsule (500 mg total) by mouth daily. May take with food to minimize GI side effects. 90 capsule 3   metoprolol succinate (TOPROL-XL) 25 MG 24 hr tablet Take 25 mg by mouth daily.      Multiple Vitamins-Minerals (MULTIVITAMIN ADULT) TABS Take 1 tablet by mouth daily     pantoprazole (PROTONIX) 40 MG tablet Take 40 mg by mouth daily.   11    triamterene-hydrochlorothiazide (MAXZIDE-25) 37.5-25 MG tablet Take 0.5 tablets by mouth daily.      No current facility-administered medications for this visit.    PHYSICAL EXAMINATION: ECOG PERFORMANCE STATUS: 1 - Symptomatic but completely ambulatory  Vitals:   05/15/20 1514  BP: 120/76  Pulse: 85  Resp: 18  Temp: (!) 97.5 F (36.4 C)  SpO2: 98%   Filed Weights   05/15/20 1514  Weight: 157 lb 6.4 oz (71.4 kg)    LABORATORY DATA:  I have reviewed the data as listed CMP Latest Ref Rng & Units 02/02/2020 07/31/2016  Glucose 65 - 99 mg/dL 77 106(H)  BUN 8 - 27 mg/dL 19 17  Creatinine 0.57 - 1.00 mg/dL 0.75 0.80  Sodium 134 - 144 mmol/L 141 141  Potassium 3.5 - 5.2 mmol/L 4.8 3.9  Chloride 96 - 106 mmol/L 103 105  CO2 20 - 29 mmol/L 26 28  Calcium 8.7 - 10.3 mg/dL 9.5 9.6    Lab Results  Component Value Date   WBC 8.7 05/15/2020   HGB 13.7 05/15/2020   HCT 40.8 05/15/2020   MCV 98.3 05/15/2020   PLT 471 (H) 05/15/2020   NEUTROABS 5.8 05/15/2020    ASSESSMENT & PLAN:  Essential thrombocytosis ([place]) 07/31/2016: hemoglobin 15.3,Platelet count 657  02/02/2020:Hemoglobin 13.1, platelets 927 03/15/20: Hb 12.2, Pl 609 05/15/2020: Hemoglobin 13.7, platelets 471  JAK2V 647mutation: Positive  This  confirms a diagnosis of essential thrombocythemia (low risk: Platelets less than thousand and no history of blood clots) Patients with JAK2 mutation are at high risk for thrombosis. The other rare adverse effect is transformation to myelofibrosis.  Treatment plan: 1.Aspirin81 mg daily started April 2021 2.hydroxyurea 500 mg daily  Responding to hydrea and tolerating it well. Return to clinic in 3 months with labs and follow-up If the labs continue to improve then we may consider reducing the number of days she takes Hydrea in the future.   Orders Placed This Encounter  Procedures   CBC with Differential ([place] Only)    Standing Status:   Future     Standing Expiration Date:   05/15/2021   The patient has a good understanding of the overall plan. she agrees with it. she will call with any problems that may develop before the next visit here.  Total time spent: 20 mins including face to face time and time spent for planning, charting and coordination of care  Nicholas Lose, MD 05/15/2020  I, Cloyde Reams Dorshimer, am acting as scribe for Dr. Nicholas Lose.  I have reviewed the above documentation for accuracy and completeness, and I agree with the above.

## 2020-05-15 ENCOUNTER — Other Ambulatory Visit: Payer: Self-pay

## 2020-05-15 ENCOUNTER — Inpatient Hospital Stay: Payer: Medicare HMO

## 2020-05-15 ENCOUNTER — Inpatient Hospital Stay: Payer: Medicare HMO | Attending: Hematology and Oncology | Admitting: Hematology and Oncology

## 2020-05-15 DIAGNOSIS — D473 Essential (hemorrhagic) thrombocythemia: Secondary | ICD-10-CM

## 2020-05-15 DIAGNOSIS — Z7982 Long term (current) use of aspirin: Secondary | ICD-10-CM | POA: Insufficient documentation

## 2020-05-15 DIAGNOSIS — E785 Hyperlipidemia, unspecified: Secondary | ICD-10-CM | POA: Diagnosis not present

## 2020-05-15 DIAGNOSIS — Z79899 Other long term (current) drug therapy: Secondary | ICD-10-CM | POA: Insufficient documentation

## 2020-05-15 DIAGNOSIS — M179 Osteoarthritis of knee, unspecified: Secondary | ICD-10-CM | POA: Diagnosis not present

## 2020-05-15 DIAGNOSIS — I10 Essential (primary) hypertension: Secondary | ICD-10-CM | POA: Diagnosis not present

## 2020-05-15 DIAGNOSIS — M1990 Unspecified osteoarthritis, unspecified site: Secondary | ICD-10-CM | POA: Diagnosis not present

## 2020-05-15 DIAGNOSIS — M8580 Other specified disorders of bone density and structure, unspecified site: Secondary | ICD-10-CM | POA: Diagnosis not present

## 2020-05-15 DIAGNOSIS — J449 Chronic obstructive pulmonary disease, unspecified: Secondary | ICD-10-CM | POA: Diagnosis not present

## 2020-05-15 DIAGNOSIS — K219 Gastro-esophageal reflux disease without esophagitis: Secondary | ICD-10-CM | POA: Diagnosis not present

## 2020-05-15 DIAGNOSIS — G4700 Insomnia, unspecified: Secondary | ICD-10-CM | POA: Diagnosis not present

## 2020-05-15 LAB — CBC WITH DIFFERENTIAL (CANCER CENTER ONLY)
Abs Immature Granulocytes: 0.02 10*3/uL (ref 0.00–0.07)
Basophils Absolute: 0.1 10*3/uL (ref 0.0–0.1)
Basophils Relative: 1 %
Eosinophils Absolute: 0.1 10*3/uL (ref 0.0–0.5)
Eosinophils Relative: 2 %
HCT: 40.8 % (ref 36.0–46.0)
Hemoglobin: 13.7 g/dL (ref 12.0–15.0)
Immature Granulocytes: 0 %
Lymphocytes Relative: 21 %
Lymphs Abs: 1.8 10*3/uL (ref 0.7–4.0)
MCH: 33 pg (ref 26.0–34.0)
MCHC: 33.6 g/dL (ref 30.0–36.0)
MCV: 98.3 fL (ref 80.0–100.0)
Monocytes Absolute: 0.8 10*3/uL (ref 0.1–1.0)
Monocytes Relative: 9 %
Neutro Abs: 5.8 10*3/uL (ref 1.7–7.7)
Neutrophils Relative %: 67 %
Platelet Count: 471 10*3/uL — ABNORMAL HIGH (ref 150–400)
RBC: 4.15 MIL/uL (ref 3.87–5.11)
RDW: 14.1 % (ref 11.5–15.5)
WBC Count: 8.7 10*3/uL (ref 4.0–10.5)
nRBC: 0 % (ref 0.0–0.2)

## 2020-05-15 NOTE — Assessment & Plan Note (Signed)
07/31/2016: hemoglobin 15.3,Platelet count 657  02/02/2020:Hemoglobin 13.1, platelets 927 03/15/20: Hb 12.2, Pl 609 05/15/2020:  JAK2V 614mutation: Positive  This confirms a diagnosis of essential thrombocythemia (low risk: Platelets less than thousand and no history of blood clots) Patients with JAK2 mutation are at high risk for thrombosis. The other rare adverse effect is transformation to myelofibrosis.  Treatment plan: 1.Aspirin81 mg daily started April 2021 2.hydroxyurea 500 mg daily  Responding to hydrea and tolerating it well. Return to clinic in 3 months with labs and follow-up

## 2020-05-24 DIAGNOSIS — Z1231 Encounter for screening mammogram for malignant neoplasm of breast: Secondary | ICD-10-CM | POA: Diagnosis not present

## 2020-07-10 DIAGNOSIS — Z961 Presence of intraocular lens: Secondary | ICD-10-CM | POA: Diagnosis not present

## 2020-07-10 DIAGNOSIS — I10 Essential (primary) hypertension: Secondary | ICD-10-CM | POA: Diagnosis not present

## 2020-07-10 DIAGNOSIS — H43811 Vitreous degeneration, right eye: Secondary | ICD-10-CM | POA: Diagnosis not present

## 2020-08-02 DIAGNOSIS — G4700 Insomnia, unspecified: Secondary | ICD-10-CM | POA: Diagnosis not present

## 2020-08-02 DIAGNOSIS — E785 Hyperlipidemia, unspecified: Secondary | ICD-10-CM | POA: Diagnosis not present

## 2020-08-02 DIAGNOSIS — I10 Essential (primary) hypertension: Secondary | ICD-10-CM | POA: Diagnosis not present

## 2020-08-02 DIAGNOSIS — M1990 Unspecified osteoarthritis, unspecified site: Secondary | ICD-10-CM | POA: Diagnosis not present

## 2020-08-02 DIAGNOSIS — J449 Chronic obstructive pulmonary disease, unspecified: Secondary | ICD-10-CM | POA: Diagnosis not present

## 2020-08-02 DIAGNOSIS — M8580 Other specified disorders of bone density and structure, unspecified site: Secondary | ICD-10-CM | POA: Diagnosis not present

## 2020-08-02 DIAGNOSIS — K219 Gastro-esophageal reflux disease without esophagitis: Secondary | ICD-10-CM | POA: Diagnosis not present

## 2020-08-02 DIAGNOSIS — M179 Osteoarthritis of knee, unspecified: Secondary | ICD-10-CM | POA: Diagnosis not present

## 2020-08-03 DIAGNOSIS — M1990 Unspecified osteoarthritis, unspecified site: Secondary | ICD-10-CM | POA: Diagnosis not present

## 2020-08-03 DIAGNOSIS — K219 Gastro-esophageal reflux disease without esophagitis: Secondary | ICD-10-CM | POA: Diagnosis not present

## 2020-08-03 DIAGNOSIS — I679 Cerebrovascular disease, unspecified: Secondary | ICD-10-CM | POA: Diagnosis not present

## 2020-08-03 DIAGNOSIS — Z0001 Encounter for general adult medical examination with abnormal findings: Secondary | ICD-10-CM | POA: Diagnosis not present

## 2020-08-03 DIAGNOSIS — I10 Essential (primary) hypertension: Secondary | ICD-10-CM | POA: Diagnosis not present

## 2020-08-03 DIAGNOSIS — M8580 Other specified disorders of bone density and structure, unspecified site: Secondary | ICD-10-CM | POA: Diagnosis not present

## 2020-08-03 DIAGNOSIS — J449 Chronic obstructive pulmonary disease, unspecified: Secondary | ICD-10-CM | POA: Diagnosis not present

## 2020-08-03 DIAGNOSIS — E559 Vitamin D deficiency, unspecified: Secondary | ICD-10-CM | POA: Diagnosis not present

## 2020-08-03 DIAGNOSIS — Z1389 Encounter for screening for other disorder: Secondary | ICD-10-CM | POA: Diagnosis not present

## 2020-08-03 DIAGNOSIS — Z79899 Other long term (current) drug therapy: Secondary | ICD-10-CM | POA: Diagnosis not present

## 2020-08-03 DIAGNOSIS — E785 Hyperlipidemia, unspecified: Secondary | ICD-10-CM | POA: Diagnosis not present

## 2020-08-03 LAB — BASIC METABOLIC PANEL
BUN: 16 (ref 4–21)
CO2: 31 — AB (ref 13–22)
Chloride: 103 (ref 99–108)
Creatinine: 0.8 (ref 0.5–1.1)
Glucose: 85
Potassium: 4.2 mEq/L (ref 3.5–5.1)
Sodium: 141 (ref 137–147)

## 2020-08-03 LAB — HEPATIC FUNCTION PANEL
ALT: 21 U/L (ref 7–35)
AST: 21 (ref 13–35)

## 2020-08-03 LAB — LIPID PANEL
Cholesterol: 145 (ref 0–200)
HDL: 55 (ref 35–70)
LDL Cholesterol: 51
Triglycerides: 253 — AB (ref 40–160)

## 2020-08-03 LAB — COMPREHENSIVE METABOLIC PANEL
Albumin: 4.6 (ref 3.5–5.0)
Calcium: 10.2 (ref 8.7–10.7)
eGFR: 78

## 2020-08-03 LAB — VITAMIN D 25 HYDROXY (VIT D DEFICIENCY, FRACTURES): Vit D, 25-Hydroxy: 48.1

## 2020-08-03 LAB — TSH: TSH: 1.08 (ref 0.41–5.90)

## 2020-08-03 LAB — CBC AND DIFFERENTIAL
HCT: 41 (ref 36–46)
Hemoglobin: 13.6 (ref 12.0–16.0)
Platelets: 628 10*3/uL — AB (ref 150–400)
WBC: 8.8

## 2020-08-03 LAB — CBC: RBC: 4.13 (ref 3.87–5.11)

## 2020-08-03 LAB — VITAMIN B12: Vitamin B-12: 429

## 2020-08-13 NOTE — Progress Notes (Signed)
Patient Care Team: Josetta Huddle, MD as PCP - General (Internal Medicine) Alda Berthold, DO as Consulting Physician (Neurology)  DIAGNOSIS:    ICD-10-CM   1. Essential thrombocytosis (HCC)  D47.3     CHIEF COMPLIANT: Follow-up ofthrombocytosis  INTERVAL HISTORY: Andrea Landry is a 80 y.o. with above-mentioned history of thrombocytosiscurrently on aspirin 81mg  daily and hydroxyurea.She presents to the clinic todayfor follow-up.  Her major issue with hydroxyurea is not she has found that her hair is thinning out.  She wants to take it less often.  She is not having any other side effects.  ALLERGIES:  is allergic to lisinopril.  MEDICATIONS:  Current Outpatient Medications  Medication Sig Dispense Refill  . amLODipine-valsartan (EXFORGE) 5-160 MG tablet Take 1 tablet by mouth every evening. 90 tablet 1  . Ascorbic Acid (VITAMIN C) 100 MG tablet Take 500 mg by mouth daily.    Marland Kitchen aspirin EC 81 MG tablet Take 1 tablet (81 mg total) by mouth daily.    Marland Kitchen atorvastatin (LIPITOR) 20 MG tablet Take 1 tablet (20 mg total) by mouth daily. 90 tablet 3  . cetirizine (ZYRTEC ALLERGY) 10 MG tablet Take 1 tablet (10 mg total) by mouth daily.    . cholecalciferol (VITAMIN D) 1000 units tablet Take 1 tablet by mouth daily    . clonazePAM (KLONOPIN) 0.5 MG tablet Take 1 tablet by mouth daily as needed for anxiety or sleep    . clopidogrel (PLAVIX) 75 MG tablet Take 1 tablet (75 mg total) by mouth daily. 90 tablet 3  . diphenhydrAMINE (BENADRYL) 25 MG tablet Take 25 mg by mouth at bedtime.     . hydroxyurea (HYDREA) 500 MG capsule Take 1 capsule (500 mg total) by mouth daily. May take with food to minimize GI side effects. 90 capsule 3  . metoprolol succinate (TOPROL-XL) 25 MG 24 hr tablet Take 25 mg by mouth daily.     . Multiple Vitamins-Minerals (MULTIVITAMIN ADULT) TABS Take 1 tablet by mouth daily    . pantoprazole (PROTONIX) 40 MG tablet Take 40 mg by mouth daily.   11  .  triamterene-hydrochlorothiazide (MAXZIDE-25) 37.5-25 MG tablet Take 0.5 tablets by mouth daily.      No current facility-administered medications for this visit.    PHYSICAL EXAMINATION: ECOG PERFORMANCE STATUS: 1 - Symptomatic but completely ambulatory  Vitals:   08/14/20 1455  BP: 124/64  Pulse: 86  Temp: (!) 97.2 F (36.2 C)  SpO2: 96%   Filed Weights   08/14/20 1455  Weight: 160 lb 1.6 oz (72.6 kg)     LABORATORY DATA:  I have reviewed the data as listed CMP Latest Ref Rng & Units 02/02/2020 07/31/2016  Glucose 65 - 99 mg/dL 77 106(H)  BUN 8 - 27 mg/dL 19 17  Creatinine 0.57 - 1.00 mg/dL 0.75 0.80  Sodium 134 - 144 mmol/L 141 141  Potassium 3.5 - 5.2 mmol/L 4.8 3.9  Chloride 96 - 106 mmol/L 103 105  CO2 20 - 29 mmol/L 26 28  Calcium 8.7 - 10.3 mg/dL 9.5 9.6    Lab Results  Component Value Date   WBC 8.4 08/14/2020   HGB 13.4 08/14/2020   HCT 40.7 08/14/2020   MCV 99.0 08/14/2020   PLT 561 (H) 08/14/2020   NEUTROABS 5.5 08/14/2020    ASSESSMENT & PLAN:  Essential thrombocytosis ([place]) 07/31/2016: hemoglobin 15.3,Platelet count 657  02/02/2020:Hemoglobin 13.1, platelets 927 03/15/20: Hb 12.2, Pl 609 05/15/2020: Hemoglobin 13.7, platelets 471 08/14/2020: Hemoglobin 13.4,  platelets 561  JAK2V 664mutation: Positive  This confirms a diagnosis of essential thrombocythemia (low risk: Platelets less than thousand and no history of blood clots) Patients with JAK2 mutation are at high risk for thrombosis. The other rare adverse effect is transformation to myelofibrosis.  Treatment plan: 1.Aspirin81 mg daily started April 2021 2.hydroxyurea 500mg  daily switched to 4 days a week on 08/14/2020  Hydrea toxicity: Hair thinning  Responding to hydrea and tolerating it well. Return to clinic in 3 months with labs and follow-up    No orders of the defined types were placed in this encounter.  The patient has a good understanding of the overall plan. she agrees  with it. she will call with any problems that may develop before the next visit here.  Total time spent: 20 mins including face to face time and time spent for planning, charting and coordination of care  Rulon Eisenmenger, MD, MPH 08/14/2020  I, Molly Dorshimer, am acting as scribe for Dr. Nicholas Lose.  I have reviewed the above documentation for accuracy and completeness, and I agree with the above.

## 2020-08-13 NOTE — Assessment & Plan Note (Signed)
07/31/2016: hemoglobin 15.3,Platelet count 657  02/02/2020:Hemoglobin 13.1, platelets 927 03/15/20: Hb 12.2, Pl 609 05/15/2020: Hemoglobin 13.7, platelets 471  JAK2V 661mutation: Positive  This confirms a diagnosis of essential thrombocythemia (low risk: Platelets less than thousand and no history of blood clots) Patients with JAK2 mutation are at high risk for thrombosis. The other rare adverse effect is transformation to myelofibrosis.  Treatment plan: 1.Aspirin81 mg daily started April 2021 2.hydroxyurea 500mg  daily  Responding to hydrea and tolerating it well. Return to clinic in 3 months with labs and follow-up If the labs continue to improve then we may consider reducing the number of days she takes Hydrea in the future.

## 2020-08-14 ENCOUNTER — Inpatient Hospital Stay: Payer: Medicare HMO

## 2020-08-14 ENCOUNTER — Inpatient Hospital Stay: Payer: Medicare HMO | Attending: Hematology and Oncology | Admitting: Hematology and Oncology

## 2020-08-14 ENCOUNTER — Other Ambulatory Visit: Payer: Self-pay

## 2020-08-14 DIAGNOSIS — Z7982 Long term (current) use of aspirin: Secondary | ICD-10-CM | POA: Diagnosis not present

## 2020-08-14 DIAGNOSIS — Z79899 Other long term (current) drug therapy: Secondary | ICD-10-CM | POA: Insufficient documentation

## 2020-08-14 DIAGNOSIS — D473 Essential (hemorrhagic) thrombocythemia: Secondary | ICD-10-CM | POA: Insufficient documentation

## 2020-08-14 LAB — CBC WITH DIFFERENTIAL (CANCER CENTER ONLY)
Abs Immature Granulocytes: 0.02 10*3/uL (ref 0.00–0.07)
Basophils Absolute: 0.1 10*3/uL (ref 0.0–0.1)
Basophils Relative: 1 %
Eosinophils Absolute: 0.2 10*3/uL (ref 0.0–0.5)
Eosinophils Relative: 3 %
HCT: 40.7 % (ref 36.0–46.0)
Hemoglobin: 13.4 g/dL (ref 12.0–15.0)
Immature Granulocytes: 0 %
Lymphocytes Relative: 21 %
Lymphs Abs: 1.8 10*3/uL (ref 0.7–4.0)
MCH: 32.6 pg (ref 26.0–34.0)
MCHC: 32.9 g/dL (ref 30.0–36.0)
MCV: 99 fL (ref 80.0–100.0)
Monocytes Absolute: 0.7 10*3/uL (ref 0.1–1.0)
Monocytes Relative: 9 %
Neutro Abs: 5.5 10*3/uL (ref 1.7–7.7)
Neutrophils Relative %: 66 %
Platelet Count: 561 10*3/uL — ABNORMAL HIGH (ref 150–400)
RBC: 4.11 MIL/uL (ref 3.87–5.11)
RDW: 13.1 % (ref 11.5–15.5)
WBC Count: 8.4 10*3/uL (ref 4.0–10.5)
nRBC: 0 % (ref 0.0–0.2)

## 2020-08-16 ENCOUNTER — Telehealth: Payer: Self-pay | Admitting: Hematology and Oncology

## 2020-08-16 NOTE — Telephone Encounter (Signed)
Scheduled per 4/11 los. Called pt and left a msg

## 2020-08-18 ENCOUNTER — Other Ambulatory Visit: Payer: Self-pay | Admitting: Hematology and Oncology

## 2020-08-18 DIAGNOSIS — I10 Essential (primary) hypertension: Secondary | ICD-10-CM

## 2020-08-22 DIAGNOSIS — M85852 Other specified disorders of bone density and structure, left thigh: Secondary | ICD-10-CM | POA: Diagnosis not present

## 2020-08-22 DIAGNOSIS — Z780 Asymptomatic menopausal state: Secondary | ICD-10-CM | POA: Diagnosis not present

## 2020-08-25 ENCOUNTER — Encounter: Payer: Self-pay | Admitting: Neurology

## 2020-08-25 ENCOUNTER — Ambulatory Visit: Payer: Medicare HMO | Admitting: Neurology

## 2020-08-25 ENCOUNTER — Other Ambulatory Visit: Payer: Self-pay

## 2020-08-25 VITALS — BP 127/61 | HR 90 | Resp 20 | Ht 67.0 in | Wt 162.0 lb

## 2020-08-25 DIAGNOSIS — I679 Cerebrovascular disease, unspecified: Secondary | ICD-10-CM

## 2020-08-25 NOTE — Progress Notes (Signed)
Follow-up Visit   Date: 08/25/20    Andrea Landry MRN: [id number] DOB: May 19, 1940   Interim History: Andrea Landry is a 80 y.o. right-handed Caucasian female with GERD, hypertension, history of breast cancer s/p right mastectomy (1999) returning to the clinic for follow-up of TIA.  The patient was accompanied to the clinic by self.  History of present illness: Initial visit 08/16/2016:  She was at church on March 25th and while talking, she developed numbness over the right corner of her lips and 4th and 5th digit on the right. At the same time, she was unable to express words and unable to spell when typing. She was still able to comprehend what was being said.  Symptoms lasted 30-minutes and completely resolved, but then would recur in the same day.  She was having ongoing symptoms almost daily until April 8th, these stereotyped spells could occur 5-10 times per day, lasting anywhere from minutes to several hours.  On March 28th, she developed right cheek twitching which was constant for 3-days.  She went to the ER on 3/28 for these symptoms where MRI brain was ordered which did not show anything worrisome, specifically no sign of acute stroke or mass, there is scattered white matter changes. Of note, her CBC showed elevated platelet count of 647.  She followed up with her PCP who ordered US carotids which was normal and had repeat CBC with platelets 584. She was also started on aspirin 81mg  daily.   She did not have any new neurological symptoms in 2018 - 2021.  She is compliant with plavix and aspirin which she takes for intracranial stenosis (left distal M1 stenosis).  She completed dual antiplatelet therapy for 3 months, the continued plavix 75mg  daily.     UPDATE 08/25/2020:  She moved to [facility] independent living with her husband in December.  She is doing well with no new neurological complaints.  She is compliant with her plavix and statin therapy. No interval illnesses,  falls, or hospitalizations. She takes aspirin for thrombocytosis.  Medications:  Current Outpatient Medications on File Prior to Visit  Medication Sig Dispense Refill  . amLODipine-valsartan (EXFORGE) 5-160 MG tablet Take 1 tablet by mouth every evening. 90 tablet 1  . Ascorbic Acid (VITAMIN C) 100 MG tablet Take 500 mg by mouth daily.    Marland Kitchen aspirin EC 81 MG tablet Take 1 tablet (81 mg total) by mouth daily.    Marland Kitchen atorvastatin (LIPITOR) 20 MG tablet Take 1 tablet (20 mg total) by mouth daily. 90 tablet 3  . cetirizine (ZYRTEC ALLERGY) 10 MG tablet Take 1 tablet (10 mg total) by mouth daily.    . cholecalciferol (VITAMIN D) 1000 units tablet Take 1 tablet by mouth daily    . clonazePAM (KLONOPIN) 0.5 MG tablet Take 1 tablet by mouth daily as needed for anxiety or sleep    . clopidogrel (PLAVIX) 75 MG tablet Take 1 tablet (75 mg total) by mouth daily. 90 tablet 3  . diphenhydrAMINE (BENADRYL) 25 MG tablet Take 25 mg by mouth at bedtime.     . hydroxyurea (HYDREA) 500 MG capsule Take 1 capsule (500 mg total) by mouth daily. May take with food to minimize GI side effects. 90 capsule 3  . Multiple Vitamins-Minerals (MULTIVITAMIN ADULT) TABS Take 1 tablet by mouth daily    . pantoprazole (PROTONIX) 40 MG tablet Take 40 mg by mouth daily.   11  . triamterene-hydrochlorothiazide (MAXZIDE-25) 37.5-25 MG tablet Take 0.5 tablets  by mouth daily.      No current facility-administered medications on file prior to visit.    Allergies:  Allergies  Allergen Reactions  . Lisinopril Swelling  . Other Other (See Comments)    Vital Signs:  BP 127/61   Pulse 90   Resp 20   Ht 5\' 7"  (1.702 m)   Wt 162 lb (73.5 kg)   LMP 01/11/1996   SpO2 95%   BMI 25.37 kg/m    Neurological Exam: MENTAL STATUS including orientation to time, place, person, recent and remote memory, attention span and concentration, language, and fund of knowledge is normal.  Speech is not dysarthric.  CRANIAL NERVES: Pupils equal  round and reactive to light.  Normal conjugate, extra-ocular eye movements in all directions of gaze.  No ptosis.  Normal facial sensation.  Face is symmetric. Palate elevates symmetrically.  Tongue is midline.  MOTOR:  Motor strength is 5/5 in all extremities.  No pronator drift.   SENSORY:  Intact to light touch and vibration.  COORDINATION/GAIT:   Gait narrow based and stable.    Data: US carotids 08/13/2016:  No significant stenosis  MRI brain wo contrast 07/31/2016:  Mild to moderate chronic microvascular ischemic changes in the white matter. No acute intracranial abnormality.  Mild mucosal edema in the paranasal sinuses.  Routine EEG 08/16/2016: normal  CTA head 09/10/2016:  1. Critical distal left M1 segment stenosis. 2. Atheromatous type irregularity of left M2 branches. Elsewhere, intracranial vessels are unremarkable. 3. 4 cm thoracic inlet mass on a single slice through the chest for contrast timing. This is likely goiter or thyroid nodule, recommend sonography for confirmation.  MRA head 08/30/2016:   1. Abrupt signal loss of the distal M1 segment of the left MCA is favored to be artifactual, as the distal branches are normal. However, a high-grade stenosis would be difficult to exclude. CTA of the brain should be considered for definitive characterization. 2. Otherwise normal intracranial MRA.  US carotids 06/29/2018:  No significant stenosis  Lab Results  Component Value Date   CHOL 184 08/19/2016   HDL 55.70 08/19/2016   LDLCALC 99 08/19/2016   TRIG 146.0 08/19/2016   CHOLHDL 3 08/19/2016     IMPRESSION/PLAN: TIA manifesting with expressive aphasia, R arm and face paresthesias due to critical distal M1 stenosis (07/2016).  She was managed with dual antiplatelet therapy and statin therapy.  Clinically, she has been doing great without any recurrence neurological events.  - Continue secondary stroke prevention with plavix 75mg  and atorvastatin 20mg /d prescribed by  PCP  - She is also taking aspirin 81mg  for thrombocytosis, recommended by hematology  - Low threshold for cerebral angiogram, if she develops any new symptoms  Return to clinic in 1 year  Total time spent reviewing records, interview, history/exam, documentation, and coordination of care on day of encounter:  20 min   Thank you for allowing me to participate in patient's care.  If I can answer any additional questions, I would be pleased to do so.    Sincerely,    Nabria Nevin K. Posey Pronto, DO

## 2020-08-25 NOTE — Patient Instructions (Signed)
Return to clinic in 1 year.

## 2020-08-31 DIAGNOSIS — K219 Gastro-esophageal reflux disease without esophagitis: Secondary | ICD-10-CM | POA: Diagnosis not present

## 2020-08-31 DIAGNOSIS — I10 Essential (primary) hypertension: Secondary | ICD-10-CM | POA: Diagnosis not present

## 2020-08-31 DIAGNOSIS — M179 Osteoarthritis of knee, unspecified: Secondary | ICD-10-CM | POA: Diagnosis not present

## 2020-08-31 DIAGNOSIS — M8580 Other specified disorders of bone density and structure, unspecified site: Secondary | ICD-10-CM | POA: Diagnosis not present

## 2020-08-31 DIAGNOSIS — E785 Hyperlipidemia, unspecified: Secondary | ICD-10-CM | POA: Diagnosis not present

## 2020-08-31 DIAGNOSIS — J449 Chronic obstructive pulmonary disease, unspecified: Secondary | ICD-10-CM | POA: Diagnosis not present

## 2020-08-31 DIAGNOSIS — M1990 Unspecified osteoarthritis, unspecified site: Secondary | ICD-10-CM | POA: Diagnosis not present

## 2020-08-31 DIAGNOSIS — G4700 Insomnia, unspecified: Secondary | ICD-10-CM | POA: Diagnosis not present

## 2020-09-06 ENCOUNTER — Other Ambulatory Visit: Payer: Self-pay | Admitting: Hematology and Oncology

## 2020-09-06 DIAGNOSIS — E785 Hyperlipidemia, unspecified: Secondary | ICD-10-CM | POA: Diagnosis not present

## 2020-09-06 DIAGNOSIS — G4700 Insomnia, unspecified: Secondary | ICD-10-CM | POA: Diagnosis not present

## 2020-09-06 DIAGNOSIS — I10 Essential (primary) hypertension: Secondary | ICD-10-CM | POA: Diagnosis not present

## 2020-09-06 DIAGNOSIS — M179 Osteoarthritis of knee, unspecified: Secondary | ICD-10-CM | POA: Diagnosis not present

## 2020-09-06 DIAGNOSIS — M1990 Unspecified osteoarthritis, unspecified site: Secondary | ICD-10-CM | POA: Diagnosis not present

## 2020-09-06 DIAGNOSIS — J449 Chronic obstructive pulmonary disease, unspecified: Secondary | ICD-10-CM | POA: Diagnosis not present

## 2020-09-06 DIAGNOSIS — M8580 Other specified disorders of bone density and structure, unspecified site: Secondary | ICD-10-CM | POA: Diagnosis not present

## 2020-09-06 DIAGNOSIS — K219 Gastro-esophageal reflux disease without esophagitis: Secondary | ICD-10-CM | POA: Diagnosis not present

## 2020-11-10 ENCOUNTER — Other Ambulatory Visit: Payer: Self-pay | Admitting: *Deleted

## 2020-11-10 DIAGNOSIS — D473 Essential (hemorrhagic) thrombocythemia: Secondary | ICD-10-CM

## 2020-11-12 NOTE — Progress Notes (Signed)
Patient Care Team: Josetta Huddle, MD as PCP - General (Internal Medicine) Alda Berthold, DO as Consulting Physician (Neurology)  DIAGNOSIS:    ICD-10-CM   1. Essential thrombocytosis (HCC)  D47.3 CBC with Differential ([facility])      CHIEF COMPLIANT:  Follow-up of thrombocytosis  INTERVAL HISTORY: Andrea Landry is a 80 y.o. with above-mentioned history of thrombocytosis currently on aspirin 81mg  daily and hydroxyurea. She presents to the clinic today for follow-up.  She is tolerating hydroxyurea extremely well without any problems or concerns.  ALLERGIES:  is allergic to lisinopril and other.  MEDICATIONS:  Current Outpatient Medications  Medication Sig Dispense Refill   amLODipine-valsartan (EXFORGE) 5-160 MG tablet Take 1 tablet by mouth every evening. 90 tablet 1   Ascorbic Acid (VITAMIN C) 100 MG tablet Take 500 mg by mouth daily.     aspirin EC 81 MG tablet Take 1 tablet (81 mg total) by mouth daily.     atorvastatin (LIPITOR) 20 MG tablet Take 1 tablet (20 mg total) by mouth daily. 90 tablet 3   cetirizine (ZYRTEC ALLERGY) 10 MG tablet Take 1 tablet (10 mg total) by mouth daily.     cholecalciferol (VITAMIN D) 1000 units tablet Take 1 tablet by mouth daily     clonazePAM (KLONOPIN) 0.5 MG tablet Take 1 tablet by mouth daily as needed for anxiety or sleep     clopidogrel (PLAVIX) 75 MG tablet Take 1 tablet (75 mg total) by mouth daily. 90 tablet 3   diphenhydrAMINE (BENADRYL) 25 MG tablet Take 25 mg by mouth at bedtime.      hydroxyurea (HYDREA) 500 MG capsule Take 1 capsule (500 mg total) by mouth daily. May take with food to minimize GI side effects. 90 capsule 3   Multiple Vitamins-Minerals (MULTIVITAMIN ADULT) TABS Take 1 tablet by mouth daily     pantoprazole (PROTONIX) 40 MG tablet Take 40 mg by mouth daily.   11   triamterene-hydrochlorothiazide (MAXZIDE-25) 37.5-25 MG tablet Take 0.5 tablets by mouth daily.      No current facility-administered  medications for this visit.    PHYSICAL EXAMINATION: ECOG PERFORMANCE STATUS: 1 - Symptomatic but completely ambulatory  Vitals:   11/13/20 1456  BP: (!) 122/54  Pulse: 77  Resp: 17  Temp: 97.8 F (36.6 C)  SpO2: 95%   Filed Weights   11/13/20 1456  Weight: 157 lb 6.4 oz (71.4 kg)     LABORATORY DATA:  I have reviewed the data as listed CMP Latest Ref Rng & Units 02/02/2020 07/31/2016  Glucose 65 - 99 mg/dL 77 106(H)  BUN 8 - 27 mg/dL 19 17  Creatinine 0.57 - 1.00 mg/dL 0.75 0.80  Sodium 134 - 144 mmol/L 141 141  Potassium 3.5 - 5.2 mmol/L 4.8 3.9  Chloride 96 - 106 mmol/L 103 105  CO2 20 - 29 mmol/L 26 28  Calcium 8.7 - 10.3 mg/dL 9.5 9.6    Lab Results  Component Value Date   WBC 8.9 11/13/2020   HGB 14.3 11/13/2020   HCT 41.3 11/13/2020   MCV 92.6 11/13/2020   PLT 516 (H) 11/13/2020   NEUTROABS 5.9 11/13/2020    ASSESSMENT & PLAN:  Essential thrombocytosis ([place]) 07/31/2016: hemoglobin 15.3, Platelet count 657 02/02/2020: Hemoglobin 13.1, platelets 927 03/15/20: Hb 12.2, Pl 609 05/15/2020: Hemoglobin 13.7, platelets 471 08/14/2020: Hemoglobin 13.4, platelets 561 11/13/2020: Hemoglobin 14.3, platelets 516   JAK2 V 617 mutation: Positive This confirms a diagnosis of essential thrombocythemia (low risk:  Platelets less than thousand and no history of blood clots) Patients with JAK2 mutation are at high risk for thrombosis. The other rare adverse effect is transformation to myelofibrosis.   Treatment plan: 1. Aspirin 81 mg daily started April 2021 2. hydroxyurea 500 mg daily switched to 4 days a week on 08/14/2020   Hydrea toxicity: Hair thinning She is [place] [employer] [place] [employer].  Responding to hydrea and tolerating it well. Return to clinic in 4 months with labs and follow-up    Orders Placed This Encounter  Procedures   CBC with Differential ([place] Only)    Standing Status:   Future    Standing  Expiration Date:   11/13/2021    The patient has a good understanding of the overall plan. she agrees with it. she will call with any problems that may develop before the next visit here.  Total time spent: 30 mins including face to face time and time spent for planning, charting and coordination of care  Rulon Eisenmenger, MD, MPH 11/13/2020  I, Thana Ates, am acting as scribe for Dr. Nicholas Lose.  I have reviewed the above documentation for accuracy and completeness, and I agree with the above.

## 2020-11-13 ENCOUNTER — Inpatient Hospital Stay: Payer: Medicare HMO

## 2020-11-13 ENCOUNTER — Other Ambulatory Visit: Payer: Self-pay

## 2020-11-13 ENCOUNTER — Inpatient Hospital Stay: Payer: Medicare HMO | Attending: Hematology and Oncology | Admitting: Hematology and Oncology

## 2020-11-13 DIAGNOSIS — D473 Essential (hemorrhagic) thrombocythemia: Secondary | ICD-10-CM | POA: Diagnosis not present

## 2020-11-13 DIAGNOSIS — Z79899 Other long term (current) drug therapy: Secondary | ICD-10-CM | POA: Insufficient documentation

## 2020-11-13 DIAGNOSIS — Z7982 Long term (current) use of aspirin: Secondary | ICD-10-CM | POA: Insufficient documentation

## 2020-11-13 LAB — CBC WITH DIFFERENTIAL (CANCER CENTER ONLY)
Abs Immature Granulocytes: 0.03 10*3/uL (ref 0.00–0.07)
Basophils Absolute: 0.1 10*3/uL (ref 0.0–0.1)
Basophils Relative: 1 %
Eosinophils Absolute: 0.2 10*3/uL (ref 0.0–0.5)
Eosinophils Relative: 2 %
HCT: 41.3 % (ref 36.0–46.0)
Hemoglobin: 14.3 g/dL (ref 12.0–15.0)
Immature Granulocytes: 0 %
Lymphocytes Relative: 22 %
Lymphs Abs: 1.9 10*3/uL (ref 0.7–4.0)
MCH: 32.1 pg (ref 26.0–34.0)
MCHC: 34.6 g/dL (ref 30.0–36.0)
MCV: 92.6 fL (ref 80.0–100.0)
Monocytes Absolute: 0.8 10*3/uL (ref 0.1–1.0)
Monocytes Relative: 9 %
Neutro Abs: 5.9 10*3/uL (ref 1.7–7.7)
Neutrophils Relative %: 66 %
Platelet Count: 516 10*3/uL — ABNORMAL HIGH (ref 150–400)
RBC: 4.46 MIL/uL (ref 3.87–5.11)
RDW: 13.3 % (ref 11.5–15.5)
WBC Count: 8.9 10*3/uL (ref 4.0–10.5)
nRBC: 0 % (ref 0.0–0.2)

## 2020-11-13 NOTE — Assessment & Plan Note (Signed)
07/31/2016: hemoglobin 15.3,Platelet count 657 02/02/2020:Hemoglobin 13.1, platelets 927 03/15/20: Hb 12.2, Pl 609 05/15/2020:Hemoglobin 13.7, platelets 471 08/14/2020: Hemoglobin 13.4, platelets 561  JAK2V 615mutation: Positive This confirms a diagnosis of essential thrombocythemia (low risk: Platelets less than thousand and no history of blood clots) Patients with JAK2 mutation are at high risk for thrombosis. The other rare adverse effect is transformation to myelofibrosis.  Treatment plan: 1.Aspirin81 mg daily started April 2021 2.hydroxyurea 500mg  daily switched to 4 days a week on 08/14/2020  Hydrea toxicity: Hair thinning  Responding to hydrea and tolerating it well. Return to clinic in56months with labs and follow-up

## 2021-01-09 DIAGNOSIS — J449 Chronic obstructive pulmonary disease, unspecified: Secondary | ICD-10-CM | POA: Diagnosis not present

## 2021-01-09 DIAGNOSIS — M1990 Unspecified osteoarthritis, unspecified site: Secondary | ICD-10-CM | POA: Diagnosis not present

## 2021-01-09 DIAGNOSIS — M8580 Other specified disorders of bone density and structure, unspecified site: Secondary | ICD-10-CM | POA: Diagnosis not present

## 2021-01-09 DIAGNOSIS — K219 Gastro-esophageal reflux disease without esophagitis: Secondary | ICD-10-CM | POA: Diagnosis not present

## 2021-01-09 DIAGNOSIS — E785 Hyperlipidemia, unspecified: Secondary | ICD-10-CM | POA: Diagnosis not present

## 2021-01-09 DIAGNOSIS — I10 Essential (primary) hypertension: Secondary | ICD-10-CM | POA: Diagnosis not present

## 2021-01-09 DIAGNOSIS — G4700 Insomnia, unspecified: Secondary | ICD-10-CM | POA: Diagnosis not present

## 2021-02-24 ENCOUNTER — Other Ambulatory Visit: Payer: Self-pay | Admitting: Hematology and Oncology

## 2021-03-17 NOTE — Progress Notes (Signed)
Patient Care Team: Josetta Huddle, MD as PCP - General (Internal Medicine) Alda Berthold, DO as Consulting Physician (Neurology)  DIAGNOSIS:    ICD-10-CM   1. Essential thrombocytosis (HCC)  D47.3       CHIEF COMPLIANT: Follow-up of thrombocytosis  INTERVAL HISTORY: Andrea Landry is a 80 y.o. with above-mentioned history of thrombocytosis currently on aspirin 81mg  daily and hydroxyurea. Andrea Landry presents to the clinic today for follow-up.  Andrea Landry is tolerating Hydrea fairly well.  Andrea Landry takes it 4 times a week.  Andrea Landry continues to stay active.  Andrea Landry plays golf twice a week.  Andrea Landry enjoyed Andrea Landry [place] the [place] in the [place].  ALLERGIES:  is allergic to lisinopril and other.  MEDICATIONS:  Current Outpatient Medications  Medication Sig Dispense Refill   amLODipine-valsartan (EXFORGE) 5-160 MG tablet Take 1 tablet by mouth every evening. 90 tablet 1   Ascorbic Acid (VITAMIN C) 100 MG tablet Take 500 mg by mouth daily.     aspirin EC 81 MG tablet Take 1 tablet (81 mg total) by mouth daily.     atorvastatin (LIPITOR) 20 MG tablet Take 1 tablet (20 mg total) by mouth daily. 90 tablet 3   cetirizine (ZYRTEC ALLERGY) 10 MG tablet Take 1 tablet (10 mg total) by mouth daily.     cholecalciferol (VITAMIN D) 1000 units tablet Take 1 tablet by mouth daily     clonazePAM (KLONOPIN) 0.5 MG tablet Take 1 tablet by mouth daily as needed for anxiety or sleep     clopidogrel (PLAVIX) 75 MG tablet Take 1 tablet (75 mg total) by mouth daily. 90 tablet 3   diphenhydrAMINE (BENADRYL) 25 MG tablet Take 25 mg by mouth at bedtime.      hydroxyurea (HYDREA) 500 MG capsule TAKE 1 CAPSULE BY MOUTH EVERY DAY 90 capsule 1   Multiple Vitamins-Minerals (MULTIVITAMIN ADULT) TABS Take 1 tablet by mouth daily     pantoprazole (PROTONIX) 40 MG tablet Take 40 mg by mouth daily.   11   triamterene-hydrochlorothiazide (MAXZIDE-25) 37.5-25 MG tablet Take 0.5 tablets by mouth daily.      No current  facility-administered medications for this visit.    PHYSICAL EXAMINATION: ECOG PERFORMANCE STATUS: 1 - Symptomatic but completely ambulatory  Vitals:   03/19/21 1509  BP: (!) 130/57  Pulse: 86  Resp: 18  Temp: 97.9 F (36.6 C)  SpO2: 96%   Filed Weights   03/19/21 1509  Weight: 157 lb 8 oz (71.4 kg)      LABORATORY DATA:  I have reviewed the data as listed CMP Latest Ref Rng & Units 02/02/2020 07/31/2016  Glucose 65 - 99 mg/dL 77 106(H)  BUN 8 - 27 mg/dL 19 17  Creatinine 0.57 - 1.00 mg/dL 0.75 0.80  Sodium 134 - 144 mmol/L 141 141  Potassium 3.5 - 5.2 mmol/L 4.8 3.9  Chloride 96 - 106 mmol/L 103 105  CO2 20 - 29 mmol/L 26 28  Calcium 8.7 - 10.3 mg/dL 9.5 9.6    Lab Results  Component Value Date   WBC 8.9 03/19/2021   HGB 12.4 03/19/2021   HCT 38.7 03/19/2021   MCV 99.0 03/19/2021   PLT 544 (H) 03/19/2021   NEUTROABS 6.1 03/19/2021    ASSESSMENT & PLAN:  Essential thrombocytosis ([place]) 07/31/2016: hemoglobin 15.3, Platelet count 657 02/02/2020: Hemoglobin 13.1, platelets 927 03/15/20: Hb 12.2, Pl 609 05/15/2020: Hemoglobin 13.7, platelets 471 08/14/2020: Hemoglobin 13.4, platelets 561 11/13/2020: Hemoglobin 14.3, platelets 516 03/19/2021: Hemoglobin 12.4,  platelets 544   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   Treatment plan: 1. Aspirin 81 mg daily started April 2021 2. hydroxyurea 500 mg daily switched to 4 days a week on 08/14/2020   Hydrea toxicity: Hair thinning Andrea Landry went [place] [employer] [place] [employer].   Responding to hydrea and tolerating it well. Return to clinic in 6 months with labs and follow-up    No orders of the defined types were placed in this encounter.  The patient has a good understanding of the overall plan. Andrea Landry agrees with it. Andrea Landry will call with any problems that may develop  before the next visit here.  Total time spent: 20 mins including face to face time and time spent for planning, charting and coordination of care  Rulon Eisenmenger, MD, MPH 03/19/2021  I, Thana Ates, am acting as scribe for Dr. Nicholas Lose.  I have reviewed the above documentation for accuracy and completeness, and I agree with the above.   What is patient okay

## 2021-03-19 ENCOUNTER — Inpatient Hospital Stay: Payer: Medicare HMO | Attending: Hematology and Oncology

## 2021-03-19 ENCOUNTER — Other Ambulatory Visit: Payer: Self-pay

## 2021-03-19 ENCOUNTER — Inpatient Hospital Stay: Payer: Medicare HMO | Admitting: Hematology and Oncology

## 2021-03-19 DIAGNOSIS — D473 Essential (hemorrhagic) thrombocythemia: Secondary | ICD-10-CM | POA: Diagnosis not present

## 2021-03-19 LAB — CBC WITH DIFFERENTIAL (CANCER CENTER ONLY)
Abs Immature Granulocytes: 0.02 10*3/uL (ref 0.00–0.07)
Basophils Absolute: 0.1 10*3/uL (ref 0.0–0.1)
Basophils Relative: 1 %
Eosinophils Absolute: 0.2 10*3/uL (ref 0.0–0.5)
Eosinophils Relative: 2 %
HCT: 38.7 % (ref 36.0–46.0)
Hemoglobin: 12.4 g/dL (ref 12.0–15.0)
Immature Granulocytes: 0 %
Lymphocytes Relative: 19 %
Lymphs Abs: 1.7 10*3/uL (ref 0.7–4.0)
MCH: 31.7 pg (ref 26.0–34.0)
MCHC: 32 g/dL (ref 30.0–36.0)
MCV: 99 fL (ref 80.0–100.0)
Monocytes Absolute: 0.7 10*3/uL (ref 0.1–1.0)
Monocytes Relative: 8 %
Neutro Abs: 6.1 10*3/uL (ref 1.7–7.7)
Neutrophils Relative %: 70 %
Platelet Count: 544 10*3/uL — ABNORMAL HIGH (ref 150–400)
RBC: 3.91 MIL/uL (ref 3.87–5.11)
RDW: 14 % (ref 11.5–15.5)
WBC Count: 8.9 10*3/uL (ref 4.0–10.5)
nRBC: 0 % (ref 0.0–0.2)

## 2021-03-19 NOTE — Assessment & Plan Note (Signed)
07/31/2016: hemoglobin 15.3,Platelet count 657 02/02/2020:Hemoglobin 13.1, platelets 927 03/15/20: Hb 12.2, Pl 609 05/15/2020:Hemoglobin 13.7, platelets 471 08/14/2020: Hemoglobin 13.4, platelets 561 11/13/2020: Hemoglobin 14.3, platelets 516  JAK2V 666mutation: Positive This confirms a diagnosis of essential thrombocythemia (low risk: Platelets less than thousand and no history of blood clots) Patients with JAK2 mutation are at high risk for thrombosis. The other rare adverse effect is transformation to myelofibrosis.  Treatment plan: 1.Aspirin81 mg daily started April 2021 2.hydroxyurea 500mg  dailyswitched to 4 days a week on 08/14/2020  Hydrea toxicity: Hair thinning She is planning on going [place] [employer] [place] [employer].  Responding to hydrea and tolerating it well. Return to clinic in73months with labs and follow-up

## 2021-05-14 DIAGNOSIS — L821 Other seborrheic keratosis: Secondary | ICD-10-CM | POA: Diagnosis not present

## 2021-05-14 DIAGNOSIS — L814 Other melanin hyperpigmentation: Secondary | ICD-10-CM | POA: Diagnosis not present

## 2021-05-14 DIAGNOSIS — L988 Other specified disorders of the skin and subcutaneous tissue: Secondary | ICD-10-CM | POA: Diagnosis not present

## 2021-06-06 DIAGNOSIS — Z1231 Encounter for screening mammogram for malignant neoplasm of breast: Secondary | ICD-10-CM | POA: Diagnosis not present

## 2021-08-21 DIAGNOSIS — K219 Gastro-esophageal reflux disease without esophagitis: Secondary | ICD-10-CM | POA: Diagnosis not present

## 2021-08-21 DIAGNOSIS — E785 Hyperlipidemia, unspecified: Secondary | ICD-10-CM | POA: Diagnosis not present

## 2021-08-21 DIAGNOSIS — I679 Cerebrovascular disease, unspecified: Secondary | ICD-10-CM | POA: Diagnosis not present

## 2021-08-21 DIAGNOSIS — D473 Essential (hemorrhagic) thrombocythemia: Secondary | ICD-10-CM | POA: Diagnosis not present

## 2021-08-21 DIAGNOSIS — I10 Essential (primary) hypertension: Secondary | ICD-10-CM | POA: Diagnosis not present

## 2021-08-21 DIAGNOSIS — Z0001 Encounter for general adult medical examination with abnormal findings: Secondary | ICD-10-CM | POA: Diagnosis not present

## 2021-08-21 DIAGNOSIS — M8580 Other specified disorders of bone density and structure, unspecified site: Secondary | ICD-10-CM | POA: Diagnosis not present

## 2021-08-21 DIAGNOSIS — G4700 Insomnia, unspecified: Secondary | ICD-10-CM | POA: Diagnosis not present

## 2021-08-21 DIAGNOSIS — J449 Chronic obstructive pulmonary disease, unspecified: Secondary | ICD-10-CM | POA: Diagnosis not present

## 2021-08-21 DIAGNOSIS — E559 Vitamin D deficiency, unspecified: Secondary | ICD-10-CM | POA: Diagnosis not present

## 2021-08-21 LAB — LIPID PANEL
Cholesterol: 127 (ref 0–200)
HDL: 53 (ref 35–70)
LDL Cholesterol: 45
Triglycerides: 180 — AB (ref 40–160)

## 2021-08-21 LAB — BASIC METABOLIC PANEL
BUN: 16 (ref 4–21)
CO2: 31 — AB (ref 13–22)
Chloride: 104 (ref 99–108)
Creatinine: 0.8 (ref 0.5–1.1)
Glucose: 121
Potassium: 3.7 mEq/L (ref 3.5–5.1)
Sodium: 141 (ref 137–147)

## 2021-08-21 LAB — HEPATIC FUNCTION PANEL
ALT: 16 U/L (ref 7–35)
AST: 19 (ref 13–35)
Bilirubin, Total: 0.9

## 2021-08-21 LAB — CBC AND DIFFERENTIAL
HCT: 37 (ref 36–46)
Hemoglobin: 13.4 (ref 12.0–16.0)
Platelets: 583 10*3/uL — AB (ref 150–400)
WBC: 7.7

## 2021-08-21 LAB — COMPREHENSIVE METABOLIC PANEL
Albumin: 4.3 (ref 3.5–5.0)
Calcium: 9.8 (ref 8.7–10.7)
eGFR: 78

## 2021-08-21 LAB — VITAMIN D 25 HYDROXY (VIT D DEFICIENCY, FRACTURES): Vit D, 25-Hydroxy: 34.7

## 2021-08-21 LAB — TSH: TSH: 0.82 (ref 0.41–5.90)

## 2021-08-21 LAB — CBC: RBC: 3.67 — AB (ref 3.87–5.11)

## 2021-08-31 ENCOUNTER — Ambulatory Visit: Payer: Medicare HMO | Admitting: Neurology

## 2021-09-03 ENCOUNTER — Telehealth: Payer: Self-pay | Admitting: Hematology and Oncology

## 2021-09-03 NOTE — Progress Notes (Incomplete)
? ?Patient Care Team: ?Josetta Huddle, MD as PCP - General (Internal Medicine) ?Alda Berthold, DO as Consulting Physician (Neurology) ? ?DIAGNOSIS: No diagnosis found. ? ?SUMMARY OF ONCOLOGIC HISTORY: ?Oncology History  ? No history exists.  ? ? ?CHIEF COMPLIANT:  Follow-up of thrombocytosis ? ?INTERVAL HISTORY: Andrea Landry is a 81 y.o. with above-mentioned history of thrombocytosis currently on aspirin '81mg'$  daily and hydroxyurea. She presents to the clinic today for follow-up ? ? ?ALLERGIES:  is allergic to lisinopril and other. ? ?MEDICATIONS:  ?Current Outpatient Medications  ?Medication Sig Dispense Refill  ? amLODipine-valsartan (EXFORGE) 5-160 MG tablet Take 1 tablet by mouth every evening. 90 tablet 1  ? Ascorbic Acid (VITAMIN C) 100 MG tablet Take 500 mg by mouth daily.    ? aspirin EC 81 MG tablet Take 1 tablet (81 mg total) by mouth daily.    ? atorvastatin (LIPITOR) 20 MG tablet Take 1 tablet (20 mg total) by mouth daily. 90 tablet 3  ? cetirizine (ZYRTEC ALLERGY) 10 MG tablet Take 1 tablet (10 mg total) by mouth daily.    ? cholecalciferol (VITAMIN D) 1000 units tablet Take 1 tablet by mouth daily    ? clonazePAM (KLONOPIN) 0.5 MG tablet Take 1 tablet by mouth daily as needed for anxiety or sleep    ? clopidogrel (PLAVIX) 75 MG tablet Take 1 tablet (75 mg total) by mouth daily. 90 tablet 3  ? diphenhydrAMINE (BENADRYL) 25 MG tablet Take 25 mg by mouth at bedtime.     ? hydroxyurea (HYDREA) 500 MG capsule TAKE 1 CAPSULE BY MOUTH EVERY DAY 90 capsule 1  ? Multiple Vitamins-Minerals (MULTIVITAMIN ADULT) TABS Take 1 tablet by mouth daily    ? pantoprazole (PROTONIX) 40 MG tablet Take 40 mg by mouth daily.   11  ? triamterene-hydrochlorothiazide (MAXZIDE-25) 37.5-25 MG tablet Take 0.5 tablets by mouth daily.     ? ?No current facility-administered medications for this visit.  ? ? ?PHYSICAL EXAMINATION: ?ECOG PERFORMANCE STATUS: {CHL ONC ECOG QM:[id number]} ? ?There were no vitals filed for this  visit. ?There were no vitals filed for this visit. ? ?BREAST:*** No palpable masses or nodules in either right or left breasts. No palpable axillary supraclavicular or infraclavicular adenopathy no breast tenderness or nipple discharge. (exam performed in the presence of a chaperone) ? ?LABORATORY DATA:  ?I have reviewed the data as listed ? ?  Latest Ref Rng & Units 02/02/2020  ?  4:08 PM 07/31/2016  ? 11:20 PM  ?CMP  ?Glucose 65 - 99 mg/dL 77   106    ?BUN 8 - 27 mg/dL 19   17    ?Creatinine 0.57 - 1.00 mg/dL 0.75   0.80    ?Sodium 134 - 144 mmol/L 141   141    ?Potassium 3.5 - 5.2 mmol/L 4.8   3.9    ?Chloride 96 - 106 mmol/L 103   105    ?CO2 20 - 29 mmol/L 26   28    ?Calcium 8.7 - 10.3 mg/dL 9.5   9.6    ? ? ?Lab Results  ?Component Value Date  ? WBC 8.9 03/19/2021  ? HGB 12.4 03/19/2021  ? HCT 38.7 03/19/2021  ? MCV 99.0 03/19/2021  ? PLT 544 (H) 03/19/2021  ? NEUTROABS 6.1 03/19/2021  ? ? ?ASSESSMENT & PLAN:  ?No problem-specific Assessment & Plan notes found for this encounter. ? ? ? ?No orders of the defined types were placed in this encounter. ? ?The patient  has a good understanding of the overall plan. she agrees with it. she will call with any problems that may develop before the next visit here. ?Total time spent: 30 mins including face to face time and time spent for planning, charting and co-ordination of care ? ? Suzzette Righter, CMA ?09/03/21 ? ? ? I Taiya Nutting, Deaunte Dente am scribing for Dr. Lindi Adie ? ?***  ?

## 2021-09-03 NOTE — Telephone Encounter (Signed)
Called patient regarding upcoming appointments, patient is notified. 

## 2021-09-06 DIAGNOSIS — Z1211 Encounter for screening for malignant neoplasm of colon: Secondary | ICD-10-CM | POA: Diagnosis not present

## 2021-09-06 DIAGNOSIS — Z1212 Encounter for screening for malignant neoplasm of rectum: Secondary | ICD-10-CM | POA: Diagnosis not present

## 2021-09-14 NOTE — Progress Notes (Signed)
Patient Care Team: Josetta Huddle, MD as PCP - General (Internal Medicine) Alda Berthold, DO as Consulting Physician (Neurology)  DIAGNOSIS:  Encounter Diagnosis  Name Primary?   Essential thrombocytosis (HCC)     CHIEF COMPLIANT: Follow-up of thrombocytosis  INTERVAL HISTORY: Andrea Landry is a 81 y.o. with above-mentioned history of thrombocytosis currently on aspirin '81mg'$  daily and hydroxyurea. She presents to the clinic today for follow-up. She denies nausea anemia and mouth sores.  She has taken hydroxyurea 4 times a week and that PS3 tolerating it extremely well without any side effects.   ALLERGIES:  is allergic to lisinopril, ace inhibitors, and other.  MEDICATIONS:  Current Outpatient Medications  Medication Sig Dispense Refill   amLODipine-valsartan (EXFORGE) 5-160 MG tablet Take 1 tablet by mouth every evening. 90 tablet 1   Ascorbic Acid (VITAMIN C) 100 MG tablet Take 500 mg by mouth daily.     aspirin EC 81 MG tablet Take 1 tablet (81 mg total) by mouth daily.     atorvastatin (LIPITOR) 20 MG tablet Take 1 tablet (20 mg total) by mouth daily. 90 tablet 3   cetirizine (ZYRTEC ALLERGY) 10 MG tablet Take 1 tablet (10 mg total) by mouth daily.     cholecalciferol (VITAMIN D) 1000 units tablet Take 1 tablet by mouth daily     clonazePAM (KLONOPIN) 0.5 MG tablet Take 1 tablet by mouth daily as needed for anxiety or sleep     clopidogrel (PLAVIX) 75 MG tablet Take 1 tablet (75 mg total) by mouth daily. 90 tablet 3   diphenhydrAMINE (BENADRYL) 25 MG tablet Take 25 mg by mouth at bedtime.      [START ON 09/25/2021] hydroxyurea (HYDREA) 500 MG capsule Take 1 capsule (500 mg total) by mouth 4 (four) times a week. May take with food to minimize GI side effects. 90 capsule 1   Multiple Vitamins-Minerals (MULTIVITAMIN ADULT) TABS Take 1 tablet by mouth daily     pantoprazole (PROTONIX) 40 MG tablet Take 40 mg by mouth daily.   11   triamterene-hydrochlorothiazide (MAXZIDE-25)  37.5-25 MG tablet Take 0.5 tablets by mouth daily.      No current facility-administered medications for this visit.    PHYSICAL EXAMINATION: ECOG PERFORMANCE STATUS: 0 - Asymptomatic  Vitals:   09/24/21 1351  BP: 121/64  Pulse: 81  Resp: 16  Temp: 98.1 F (36.7 C)  SpO2: 95%   Filed Weights   09/24/21 1351  Weight: 159 lb 8 oz (72.3 kg)     LABORATORY DATA:  I have reviewed the data as listed    Latest Ref Rng & Units 02/02/2020    4:08 PM 07/31/2016   11:20 PM  CMP  Glucose 65 - 99 mg/dL 77   106    BUN 8 - 27 mg/dL 19   17    Creatinine 0.57 - 1.00 mg/dL 0.75   0.80    Sodium 134 - 144 mmol/L 141   141    Potassium 3.5 - 5.2 mmol/L 4.8   3.9    Chloride 96 - 106 mmol/L 103   105    CO2 20 - 29 mmol/L 26   28    Calcium 8.7 - 10.3 mg/dL 9.5   9.6      Lab Results  Component Value Date   WBC 8.5 09/24/2021   HGB 13.2 09/24/2021   HCT 39.9 09/24/2021   MCV 97.3 09/24/2021   PLT 570 (H) 09/24/2021   NEUTROABS 5.8 09/24/2021  ASSESSMENT & PLAN:  Essential thrombocytosis ([place]) 07/31/2016: hemoglobin 15.3, Platelet count 657 02/02/2020: Hemoglobin 13.1, platelets 927 03/15/20: Hb 12.2, Pl 609 05/15/2020: Hemoglobin 13.7, platelets 471 08/14/2020: Hemoglobin 13.4, platelets 561 11/13/2020: Hemoglobin 14.3, platelets 516 03/19/2021: Hemoglobin 12.4, platelets 544 09/24/2021: Hemoglobin 13.2, platelets 570   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   Treatment plan: 1. Aspirin 81 mg daily started April 2021 2. hydroxyurea 500 mg daily switched to 4 days a week on 08/14/2020   Hydrea toxicity: Hair thinning She is going to [place] and going to see [facility] from them.   Responding to hydrea and tolerating it well. Return to clinic in 1 year with labs and follow-up    No orders of the defined  types were placed in this encounter.  The patient has a good understanding of the overall plan. she agrees with it. she will call with any problems that may develop before the next visit here. Total time spent: 30 mins including face to face time and time spent for planning, charting and co-ordination of care   Harriette Ohara, MD 09/24/21    I Gardiner Coins am scribing for Dr. Lindi Adie  I have reviewed the above documentation for accuracy and completeness, and I agree with the above.

## 2021-09-15 LAB — COLOGUARD: COLOGUARD: NEGATIVE

## 2021-09-17 ENCOUNTER — Inpatient Hospital Stay: Payer: Medicare HMO

## 2021-09-17 ENCOUNTER — Inpatient Hospital Stay: Payer: Medicare HMO | Admitting: Hematology and Oncology

## 2021-09-24 ENCOUNTER — Inpatient Hospital Stay: Payer: Medicare HMO | Attending: Hematology and Oncology

## 2021-09-24 ENCOUNTER — Other Ambulatory Visit: Payer: Self-pay

## 2021-09-24 ENCOUNTER — Inpatient Hospital Stay: Payer: Medicare HMO | Admitting: Hematology and Oncology

## 2021-09-24 DIAGNOSIS — D473 Essential (hemorrhagic) thrombocythemia: Secondary | ICD-10-CM | POA: Diagnosis not present

## 2021-09-24 LAB — CBC WITH DIFFERENTIAL (CANCER CENTER ONLY)
Abs Immature Granulocytes: 0.03 10*3/uL (ref 0.00–0.07)
Basophils Absolute: 0.1 10*3/uL (ref 0.0–0.1)
Basophils Relative: 1 %
Eosinophils Absolute: 0.2 10*3/uL (ref 0.0–0.5)
Eosinophils Relative: 3 %
HCT: 39.9 % (ref 36.0–46.0)
Hemoglobin: 13.2 g/dL (ref 12.0–15.0)
Immature Granulocytes: 0 %
Lymphocytes Relative: 18 %
Lymphs Abs: 1.5 10*3/uL (ref 0.7–4.0)
MCH: 32.2 pg (ref 26.0–34.0)
MCHC: 33.1 g/dL (ref 30.0–36.0)
MCV: 97.3 fL (ref 80.0–100.0)
Monocytes Absolute: 0.8 10*3/uL (ref 0.1–1.0)
Monocytes Relative: 10 %
Neutro Abs: 5.8 10*3/uL (ref 1.7–7.7)
Neutrophils Relative %: 68 %
Platelet Count: 570 10*3/uL — ABNORMAL HIGH (ref 150–400)
RBC: 4.1 MIL/uL (ref 3.87–5.11)
RDW: 13.3 % (ref 11.5–15.5)
WBC Count: 8.5 10*3/uL (ref 4.0–10.5)
nRBC: 0 % (ref 0.0–0.2)

## 2021-09-24 MED ORDER — HYDROXYUREA 500 MG PO CAPS: 500.0000 mg | ORAL_CAPSULE | ORAL | 1 refills | Status: DC

## 2021-09-24 NOTE — Assessment & Plan Note (Signed)
07/31/2016: hemoglobin 15.3,Platelet count 657 02/02/2020:Hemoglobin 13.1, platelets 927 03/15/20: Hb 12.2, Pl 609 05/15/2020:Hemoglobin 13.7, platelets 471 08/14/2020: Hemoglobin 13.4, platelets 561 11/13/2020: Hemoglobin 14.3, platelets 516 03/19/2021: Hemoglobin 12.4, platelets 544 09/24/2021:  JAK2V 648mtation: Positive Essential thrombocythemia: (low risk: Platelets less than thousand and no history of blood clots) The other rare adverse effect is transformation to myelofibrosis.  Treatment plan: 1.Aspirin81 mg daily started April 2021 2.hydroxyurea '500mg'$  dailyswitched to 4 days a week on 08/14/2020  Hydrea toxicity: [place] thin[place]  Responding to hydrea and tolerating it well. Return to clinic in619monthwith labs and follow-up

## 2021-11-08 ENCOUNTER — Ambulatory Visit: Payer: Medicare HMO | Admitting: Nurse Practitioner

## 2021-11-08 ENCOUNTER — Encounter: Payer: Self-pay | Admitting: Nurse Practitioner

## 2021-11-08 VITALS — BP 134/78 | HR 81 | Temp 98.4°F | Ht 67.0 in | Wt 159.5 lb

## 2021-11-08 DIAGNOSIS — K219 Gastro-esophageal reflux disease without esophagitis: Secondary | ICD-10-CM

## 2021-11-08 DIAGNOSIS — M8580 Other specified disorders of bone density and structure, unspecified site: Secondary | ICD-10-CM | POA: Diagnosis not present

## 2021-11-08 DIAGNOSIS — D473 Essential (hemorrhagic) thrombocythemia: Secondary | ICD-10-CM

## 2021-11-08 DIAGNOSIS — J302 Other seasonal allergic rhinitis: Secondary | ICD-10-CM

## 2021-11-08 DIAGNOSIS — E785 Hyperlipidemia, unspecified: Secondary | ICD-10-CM

## 2021-11-08 DIAGNOSIS — R053 Chronic cough: Secondary | ICD-10-CM

## 2021-11-08 DIAGNOSIS — Z8673 Personal history of transient ischemic attack (TIA), and cerebral infarction without residual deficits: Secondary | ICD-10-CM

## 2021-11-08 DIAGNOSIS — I10 Essential (primary) hypertension: Secondary | ICD-10-CM | POA: Diagnosis not present

## 2021-11-08 NOTE — Patient Instructions (Signed)
Andrea Landry Address: [name] [place], [place] [address] Phone: [phone]

## 2021-11-08 NOTE — Progress Notes (Signed)
Careteam: Patient Care Team: Lauree Chandler, NP as PCP - General (Geriatric Medicine) Alda Berthold, DO as Consulting Physician (Neurology)  Advanced Directive information Does Patient Have a Medical Advance Directive?: Yes, Type of Advance Directive: Out of facility DNR (pink MOST or yellow form), Pre-existing out of facility DNR order (yellow form or pink MOST form): Pink MOST form placed in chart (order not valid for inpatient use), Does patient want to make changes to medical advance directive?: No - Patient declined  Allergies  Allergen Reactions   Lisinopril Swelling   Ace Inhibitors     Other reaction(s): mild angioedema   Other Other (See Comments)    Chief Complaint  Patient presents with   [place] patient to establish care at [facility] clinic. Discuss Pneumonia vaccine, Dr.Gates had told patient that she needs another one. Patient stopped back by the clinic to being pill bottles at 1:10 pm. All pill bottles verified with the exceptions of Clonazepam.     HPI: Patient is a 81 y.o. female seen in today at the [facility] to establish care.  Previous PCP retiring. Last blood work in march with Dr Inda Merlin.   Hypertension- on amlodipine- valsartan 5-160 mg with triamterene- hctz  Hx of TIA- on asa 81 mg daily and plavix- seeing Dr Posey Pronto, neurology  Hyperlipidemia- on lipitor 20 mg daily   Osteopenia- on vit D 1000 units. Not on calcium supplement, feels like she gets this from diet.   Has klonopin and will rarely take, will use as needed for sleep. Will also use benadryl PRN Also has melatonin that she will take if needed.   Essential thrombocytosis- seeing hematology routinely. Taking hydrea for times daily  Seasonal allergies- taking claritin daily now, she is coughing a lot. Has had a cough for ~ 6 months. Recently changed from zyrtec to claritin to help control symptoms.   GERD- controlled on protonix and dietary  modifications.   In 1999 she had breast cancer s/p right mastectomy    Last AWV 08/21/20 Review of Systems:   Review of Systems  Constitutional:  Negative for chills, fever and weight loss.  HENT:  Negative for tinnitus.   Respiratory:  Positive for cough. Negative for sputum production and shortness of breath.   Cardiovascular:  Negative for chest pain, palpitations and leg swelling.  Gastrointestinal:  Negative for abdominal pain, constipation, diarrhea and heartburn.  Genitourinary:  Negative for dysuria, frequency and urgency.  Musculoskeletal:  Negative for back pain, falls, joint pain and myalgias.  Skin: Negative.   Neurological:  Negative for dizziness and headaches.  Psychiatric/Behavioral:  Negative for depression and memory loss. The patient does not have insomnia.    Past Medical History:  Diagnosis Date   Abnormal nuclear stress test    Cancer [facility])    BREAST CANCER   DOE (dyspnea on exertion)    GERD (gastroesophageal reflux disease)    Hyperlipidemia    Hypertension    Osteopenia 03/2014   T score -1.4 FRAX 10%/1.8%. No change from prior DEXA   SOB (shortness of breath)    TIA (transient ischemic attack)    Past Surgical History:  Procedure Laterality Date   BREAST ENHANCEMENT SURGERY     FOLLOWING MASTECTOMY FOR BREAST CANCER   BREAST SURGERY     MULTIPLE BREAST BIOPSIES,    MASTECTOMY     RIGHT BREAST   right hammer toe and Bunion  2013  Social History:   reports that she quit smoking about 33 years ago. She has a 40.00 pack-year smoking history. She has never used smokeless tobacco. She reports current alcohol use of about 10.0 standard drinks of alcohol per week. She reports that she does not use drugs.  Family History  Problem Relation Age of Onset   Cancer Mother        RENAL   Other Father    Cerebral palsy Daughter    Healthy Son     Medications: Patient's Medications  New Prescriptions   No medications on file  Previous Medications    AMLODIPINE-VALSARTAN (EXFORGE) 5-160 MG TABLET    Take 1 tablet by mouth daily.   ASPIRIN EC 81 MG TABLET    Take 1 tablet (81 mg total) by mouth daily.   ATORVASTATIN (LIPITOR) 20 MG TABLET    Take 1 tablet (20 mg total) by mouth daily.   CHOLECALCIFEROL (VITAMIN D) 1000 UNITS TABLET    Take 1 tablet by mouth daily   CLONAZEPAM (KLONOPIN) 0.5 MG TABLET    Take 1 tablet by mouth daily as needed for anxiety or sleep   CLOPIDOGREL (PLAVIX) 75 MG TABLET    Take 1 tablet (75 mg total) by mouth daily.   DIPHENHYDRAMINE (BENADRYL) 25 MG TABLET    Take 25 mg by mouth at bedtime.    HYDROXYUREA (HYDREA) 500 MG CAPSULE    Take 1 capsule (500 mg total) by mouth 4 (four) times a week. May take with food to minimize GI side effects.   LORATADINE (CLARITIN) 10 MG TABLET    Take 10 mg by mouth daily.   MULTIPLE VITAMINS-MINERALS (MULTIVITAMIN ADULT) TABS    Take 1 tablet by mouth daily   PANTOPRAZOLE (PROTONIX) 40 MG TABLET    Take 40 mg by mouth daily.    TRIAMTERENE-HYDROCHLOROTHIAZIDE (MAXZIDE-25) 37.5-25 MG TABLET    Take 0.5 tablets by mouth daily.    VITAMIN C (ASCORBIC ACID) 500 MG TABLET    Take 500 mg by mouth daily.  Modified Medications   No medications on file  Discontinued Medications   AMLODIPINE-VALSARTAN (EXFORGE) 5-160 MG TABLET    Take 1 tablet by mouth every evening.   ASCORBIC ACID (VITAMIN C) 100 MG TABLET    Take 500 mg by mouth daily.   CETIRIZINE (ZYRTEC ALLERGY) 10 MG TABLET    Take 1 tablet (10 mg total) by mouth daily.    Physical Exam:  Vitals:   11/08/21 1114  BP: 134/78  Pulse: 81  Temp: 98.4 F (36.9 C)  TempSrc: Temporal  SpO2: 96%  Weight: 159 lb 8 oz (72.3 kg)  Height: '5\' 7"'$  (1.702 m)   Body mass index is 24.98 kg/m. Wt Readings from Last 3 Encounters:  11/08/21 159 lb 8 oz (72.3 kg)  09/24/21 159 lb 8 oz (72.3 kg)  03/19/21 157 lb 8 oz (71.4 kg)    Physical Exam Constitutional:      General: She is not in acute distress.    Appearance: She is  well-developed. She is not diaphoretic.  HENT:     Head: Normocephalic and atraumatic.     Mouth/Throat:     Pharynx: No oropharyngeal exudate.  Eyes:     Conjunctiva/sclera: Conjunctivae normal.     Pupils: Pupils are equal, round, and reactive to light.  Cardiovascular:     Rate and Rhythm: Normal rate and regular rhythm.     Heart sounds: Normal heart sounds.  Pulmonary:  Effort: Pulmonary effort is normal.     Breath sounds: Normal breath sounds.  Abdominal:     General: Bowel sounds are normal.     Palpations: Abdomen is soft.  Musculoskeletal:     Cervical back: Normal range of motion and neck supple.     Right lower leg: No edema.     Left lower leg: No edema.  Skin:    General: Skin is warm and dry.  Neurological:     Mental Status: She is alert.  Psychiatric:        Mood and Affect: Mood normal.     Labs reviewed: Basic Metabolic Panel: No results for input(s): "NA", "K", "CL", "CO2", "GLUCOSE", "BUN", "CREATININE", "CALCIUM", "MG", "PHOS", "TSH" in the last 8760 hours. Liver Function Tests: No results for input(s): "AST", "ALT", "ALKPHOS", "BILITOT", "PROT", "ALBUMIN" in the last 8760 hours. No results for input(s): "LIPASE", "AMYLASE" in the last 8760 hours. No results for input(s): "AMMONIA" in the last 8760 hours. CBC: Recent Labs    11/13/20 1426 03/19/21 1451 09/24/21 1347  WBC 8.9 8.9 8.5  NEUTROABS 5.9 6.1 5.8  HGB 14.3 12.4 13.2  HCT 41.3 38.7 39.9  MCV 92.6 99.0 97.3  PLT 516* 544* 570*   Lipid Panel: No results for input(s): "CHOL", "HDL", "LDLCALC", "TRIG", "CHOLHDL", "LDLDIRECT" in the last 8760 hours. TSH: No results for input(s): "TSH" in the last 8760 hours. A1C: No results found for: "HGBA1C"   Assessment/Plan 1. Gastroesophageal reflux disease without esophagitis -controlled on protonix with dietary modifications.   2. Hx of TIA (transient ischemic attack) and stroke -stable continues to follow up with neurology, continues  on ASA and plavix  3. Seasonal allergies -has recently changed from zyrtec to claritin   4. Essential hypertension -Blood pressure well controlled Continue current medications Recheck metabolic panel  5. Essential thrombocytosis ([place]) Followed by hematology, continues on hydrea.  6. Hyperlipidemia, unspecified hyperlipidemia type -continues on lipitor, will get records for lab review.   7. Osteopenia, unspecified location Recommended to take calcium 600 mg twice daily with Vitamin D 2000 units daily and weight bearing activity 30 mins/5 days a week. She stays very active with playing golf twice weekly and walking around [place].   8. Chronic cough Hx of smoking, reports she has been told she has COPD in the past, will get records from prior PCP, she has not had recent chest xray so will follow up on this.  - DG Chest 2 View; Future   Next appt: 01/10/2022 Carlos American. [place], [facility] [phone]

## 2021-11-21 ENCOUNTER — Ambulatory Visit
Admission: RE | Admit: 2021-11-21 | Discharge: 2021-11-21 | Disposition: A | Discharge status: Home / Self Care | Payer: Medicare HMO | Source: Ambulatory Visit | Attending: Nurse Practitioner | Admitting: Nurse Practitioner

## 2021-11-21 DIAGNOSIS — R059 Cough, unspecified: Secondary | ICD-10-CM | POA: Diagnosis not present

## 2021-11-21 DIAGNOSIS — R053 Chronic cough: Secondary | ICD-10-CM | POA: Insufficient documentation

## 2021-11-21 DIAGNOSIS — R0600 Dyspnea, unspecified: Secondary | ICD-10-CM | POA: Diagnosis not present

## 2021-12-10 ENCOUNTER — Ambulatory Visit (INDEPENDENT_AMBULATORY_CARE_PROVIDER_SITE_OTHER): Payer: Medicare HMO | Admitting: Neurology

## 2021-12-10 ENCOUNTER — Encounter: Payer: Self-pay | Admitting: Neurology

## 2021-12-10 VITALS — BP 118/65 | HR 77 | Ht 67.0 in | Wt 161.0 lb

## 2021-12-10 DIAGNOSIS — I679 Cerebrovascular disease, unspecified: Secondary | ICD-10-CM | POA: Diagnosis not present

## 2021-12-10 NOTE — Progress Notes (Signed)
Follow-up Visit   Date: 12/10/21    Andrea Landry MRN: [id number] DOB: 1940/05/13   Interim History: Andrea Landry is a 81 y.o. right-handed Caucasian female with GERD, hypertension, history of breast cancer s/p right mastectomy (1999) returning to the clinic for follow-up of TIA.  The patient was accompanied to the clinic by self.  History of present illness: Initial visit 08/16/2016:  She [place] at church [place] and while talking, she developed numbness over the right corner of her lips and 4th and 5th digit on the right. At the same time, she was unable to express words and unable to spell when typing. She was still able to comprehend what was being said.  Symptoms lasted 30-minutes and completely resolved, but then would recur in the same day.  She was having ongoing symptoms almost daily until April 8th, these stereotyped spells could occur 5-10 times per day, lasting anywhere from minutes to several hours.  On March 28th, she developed right cheek twitching which was constant for 3-days.  She went to the ER on 3/28 for these symptoms where MRI brain was ordered which did not show anything worrisome, specifically no sign of acute stroke or mass, there is scattered white matter changes. Of note, her CBC showed elevated platelet count of 647.  She followed up with her PCP who ordered US carotids which was normal and had repeat CBC with platelets 584. She was also started on aspirin '81mg'$  daily.    She did not have any new neurological symptoms in 2018 - 2021.  She is compliant with plavix and aspirin which she takes for intracranial stenosis (left distal M1 stenosis).  She completed dual antiplatelet therapy for 3 months, the continued plavix '75mg'$  daily.     She moved to [facility] independent living with her husband in December 2021.    UPDATE 12/10/2021:  She is here for follow-up visit.  She is doing great and denies any new neurological symptoms.  She is compliant with plavix and  statin, as well as aspirin which she takes for thrombocytosis.    Medications:  Current Outpatient Medications on File Prior to Visit  Medication Sig Dispense Refill   amLODipine-valsartan (EXFORGE) 5-160 MG tablet Take 1 tablet by mouth daily.     aspirin EC 81 MG tablet Take 1 tablet (81 mg total) by mouth daily.     atorvastatin (LIPITOR) 20 MG tablet Take 1 tablet (20 mg total) by mouth daily. 90 tablet 3   cholecalciferol (VITAMIN D) 1000 units tablet Take 1 tablet by mouth daily     clonazePAM (KLONOPIN) 0.5 MG tablet Take 1 tablet by mouth daily as needed for anxiety or sleep     clopidogrel (PLAVIX) 75 MG tablet Take 1 tablet (75 mg total) by mouth daily. 90 tablet 3   hydroxyurea (HYDREA) 500 MG capsule Take 1 capsule (500 mg total) by mouth 4 (four) times a week. May take with food to minimize GI side effects. 90 capsule 1   loratadine (CLARITIN) 10 MG tablet Take 10 mg by mouth daily.     Multiple Vitamins-Minerals (MULTIVITAMIN ADULT) TABS Take 1 tablet by mouth daily     pantoprazole (PROTONIX) 40 MG tablet Take 40 mg by mouth daily.   11   triamterene-hydrochlorothiazide (MAXZIDE-25) 37.5-25 MG tablet Take 0.5 tablets by mouth daily.      vitamin C (ASCORBIC ACID) 500 MG tablet Take 500 mg by mouth daily.     No  current facility-administered medications on file prior to visit.    Allergies:  Allergies  Allergen Reactions   Lisinopril Swelling   Ace Inhibitors     Other reaction(s): mild angioedema   Other Other (See Comments)    Vital Signs:  BP 118/65   Pulse 77   Ht '5\' 7"'$  (1.702 m)   Wt 161 lb (73 kg)   LMP 01/11/1996   SpO2 93%   BMI 25.22 kg/m    Neurological Exam: MENTAL STATUS including orientation to time, place, person, recent and remote memory, attention span and concentration, language, and fund of knowledge is normal.  Speech is not dysarthric.  CRANIAL NERVES: Pupils equal round and reactive to light.  Normal conjugate, extra-ocular eye movements  in all directions of gaze.  No ptosis.   Face is symmetric. Palate elevates symmetrically.  Tongue is midline.  MOTOR:  Motor strength is 5/5 in all extremities.  No pronator drift.   SENSORY:  Intact to light touch and vibration.  COORDINATION/GAIT:   Gait narrow based and stable. Stressed and tandem gait intact.   Data: US carotids 08/13/2016:  No significant stenosis   MRI brain wo contrast 07/31/2016:  Mild to moderate chronic microvascular ischemic changes in the white matter. No acute intracranial abnormality.  Mild mucosal edema in the paranasal sinuses.  Routine EEG 08/16/2016: normal  CTA head 09/10/2016:  1. Critical distal left M1 segment stenosis. 2. Atheromatous type irregularity of left M2 branches. Elsewhere, intracranial vessels are unremarkable. 3. 4 cm thoracic inlet mass on a single slice through the chest for contrast timing. This is likely goiter or thyroid nodule, recommend sonography for confirmation.  MRA head 08/30/2016:   1. Abrupt signal loss of the distal M1 segment of the left MCA is favored to be artifactual, as the distal branches are normal. However, a high-grade stenosis would be difficult to exclude. CTA of the brain should be considered for definitive characterization. 2. Otherwise normal intracranial MRA.  US carotids 06/29/2018:  No significant stenosis  Lab Results  Component Value Date   CHOL 184 08/19/2016   HDL 55.70 08/19/2016   LDLCALC 99 08/19/2016   TRIG 146.0 08/19/2016   CHOLHDL 3 08/19/2016     IMPRESSION/PLAN: TIA manifesting with expressive aphasia, R arm and face paresthesias due to critical distal M1 stenosis (07/2016).  She is managed with dual antiplatelet therapy and statin therapy. Clinically, she is doing great without new neurological symptoms  - Continue plavix '75mg'$  + atorvastatin '20mg'$  daily, prescribed by PCP  - She is also on aspirin '81mg'$  for thrombocytosis by hematology  - Low threshold for cerebral angiogram, if she  develops any new stroke-like symptoms  Return to clinic as needed  Thank you for allowing me to participate in patient's care.  If I can answer any additional questions, I would be pleased to do so.    Sincerely,    Johan Creveling K. Posey Pronto, DO

## 2022-01-10 ENCOUNTER — Encounter: Payer: Self-pay | Admitting: Nurse Practitioner

## 2022-01-10 ENCOUNTER — Ambulatory Visit: Payer: Medicare HMO | Admitting: Nurse Practitioner

## 2022-01-10 VITALS — BP 118/70 | HR 72 | Temp 98.2°F | Resp 20

## 2022-01-10 DIAGNOSIS — R053 Chronic cough: Secondary | ICD-10-CM

## 2022-01-10 DIAGNOSIS — I10 Essential (primary) hypertension: Secondary | ICD-10-CM

## 2022-01-10 DIAGNOSIS — D473 Essential (hemorrhagic) thrombocythemia: Secondary | ICD-10-CM

## 2022-01-10 DIAGNOSIS — K219 Gastro-esophageal reflux disease without esophagitis: Secondary | ICD-10-CM | POA: Diagnosis not present

## 2022-01-10 DIAGNOSIS — M8580 Other specified disorders of bone density and structure, unspecified site: Secondary | ICD-10-CM | POA: Diagnosis not present

## 2022-01-10 DIAGNOSIS — J302 Other seasonal allergic rhinitis: Secondary | ICD-10-CM

## 2022-01-10 DIAGNOSIS — E785 Hyperlipidemia, unspecified: Secondary | ICD-10-CM

## 2022-01-10 DIAGNOSIS — Z8673 Personal history of transient ischemic attack (TIA), and cerebral infarction without residual deficits: Secondary | ICD-10-CM

## 2022-01-10 NOTE — Progress Notes (Signed)
Careteam: Patient Care Team: Lauree Chandler, NP as PCP - General (Geriatric Medicine) Alda Berthold, DO as Consulting Physician (Neurology)  Advanced Directive information Does Patient Have a Medical Advance Directive?: Yes, Type of Advance Directive: Out of facility DNR (pink MOST or yellow form), Pre-existing out of facility DNR order (yellow form or pink MOST form): Pink MOST form placed in chart (order not valid for inpatient use), Does patient want to make changes to medical advance directive?: No - Patient declined  Allergies  Allergen Reactions   Lisinopril Swelling   Ace Inhibitors     Other reaction(s): mild angioedema   Other Other (See Comments)    Chief Complaint  Patient presents with   Medical Management of Chronic Issues    Routine follow-up. Discuss need for covid booster, td/tdap, and flu vaccine.      HPI: Patient is a 81 y.o. female seen in today at the [facility] for routine follow up.  She is doing well. Doing water aerobics on Tuesday and Thursday morning.   She is having a hard time sleeping, melatonin does not help.   Continues to have chronic cough, hx of smoking, chest xray was negative.   GERD- on Protonix daily   She was released from neurology for cerebrovascular disease continues on plavix, asa and statin  Thrombocytosis- followed by oncology- continues on hydroxyurea and asa daily   We have not gotten medical records from prior PCP or labs since last visit.  Review of Systems:  Review of Systems  Constitutional:  Negative for chills, fever and weight loss.  HENT:  Negative for tinnitus.   Respiratory:  Negative for cough, sputum production and shortness of breath.   Cardiovascular:  Negative for chest pain, palpitations and leg swelling.  Gastrointestinal:  Negative for abdominal pain, constipation, diarrhea and heartburn.  Genitourinary:  Negative for dysuria, frequency and urgency.  Musculoskeletal:  Negative for back  pain, falls, joint pain and myalgias.  Skin: Negative.   Neurological:  Negative for dizziness and headaches.  Psychiatric/Behavioral:  Negative for depression and memory loss. The patient does not have insomnia.     Past Medical History:  Diagnosis Date   Abnormal nuclear stress test    Cancer [facility])    BREAST CANCER   DOE (dyspnea on exertion)    GERD (gastroesophageal reflux disease)    Hyperlipidemia    Hypertension    Osteopenia 03/2014   T score -1.4 FRAX 10%/1.8%. No change from prior DEXA   SOB (shortness of breath)    TIA (transient ischemic attack)    Past Surgical History:  Procedure Laterality Date   BREAST ENHANCEMENT SURGERY     FOLLOWING MASTECTOMY FOR BREAST CANCER   BREAST SURGERY     MULTIPLE BREAST BIOPSIES,    MASTECTOMY     RIGHT BREAST   right hammer toe and Bunion  05/07/2011   Social History:   reports that she quit smoking about 33 years ago. Her smoking use included cigarettes. She has a 40.00 pack-year smoking history. She has never used smokeless tobacco. She reports current alcohol use of about 10.0 standard drinks of alcohol per week. She reports that she does not use drugs.  Family History  Problem Relation Age of Onset   Cancer Mother        RENAL   Other Father    Cerebral palsy Daughter    Healthy Son     Medications: Patient's Medications  New Prescriptions  No medications on file  Previous Medications   AMLODIPINE-VALSARTAN (EXFORGE) 5-160 MG TABLET    Take 1 tablet by mouth daily.   ASPIRIN EC 81 MG TABLET    Take 1 tablet (81 mg total) by mouth daily.   ATORVASTATIN (LIPITOR) 20 MG TABLET    Take 1 tablet (20 mg total) by mouth daily.   CHOLECALCIFEROL (VITAMIN D) 1000 UNITS TABLET    Take 1 tablet by mouth daily   CLONAZEPAM (KLONOPIN) 0.5 MG TABLET    Take 1 tablet by mouth daily as needed for anxiety or sleep   CLOPIDOGREL (PLAVIX) 75 MG TABLET    Take 1 tablet (75 mg total) by mouth daily.   HYDROXYUREA (HYDREA) 500 MG  CAPSULE    Take 1 capsule (500 mg total) by mouth 4 (four) times a week. May take with food to minimize GI side effects.   LORATADINE (CLARITIN) 10 MG TABLET    Take 10 mg by mouth daily.   MULTIPLE VITAMINS-MINERALS (MULTIVITAMIN ADULT) TABS    Take 1 tablet by mouth daily   PANTOPRAZOLE (PROTONIX) 40 MG TABLET    Take 40 mg by mouth daily.    TRIAMTERENE-HYDROCHLOROTHIAZIDE (MAXZIDE-25) 37.5-25 MG TABLET    Take 0.5 tablets by mouth daily.    VITAMIN C (ASCORBIC ACID) 500 MG TABLET    Take 500 mg by mouth daily.  Modified Medications   No medications on file  Discontinued Medications   No medications on file    Physical Exam:  Vitals:   01/10/22 1100  BP: 118/70  Pulse: 72  Resp: 20  Temp: 98.2 F (36.8 C)   There is no height or weight on file to calculate BMI. Wt Readings from Last 3 Encounters:  12/10/21 161 lb (73 kg)  11/08/21 159 lb 8 oz (72.3 kg)  09/24/21 159 lb 8 oz (72.3 kg)    Physical Exam Constitutional:      General: She is not in acute distress.    Appearance: She is well-developed. She is not diaphoretic.  HENT:     Head: Normocephalic and atraumatic.     Mouth/Throat:     Pharynx: No oropharyngeal exudate.  Eyes:     Conjunctiva/sclera: Conjunctivae normal.     Pupils: Pupils are equal, round, and reactive to light.  Cardiovascular:     Rate and Rhythm: Normal rate and regular rhythm.     Heart sounds: Normal heart sounds.  Pulmonary:     Effort: Pulmonary effort is normal.     Breath sounds: Normal breath sounds.  Abdominal:     General: Bowel sounds are normal.     Palpations: Abdomen is soft.  Musculoskeletal:     Cervical back: Normal range of motion and neck supple.     Right lower leg: No edema.     Left lower leg: No edema.  Skin:    General: Skin is warm and dry.  Neurological:     Mental Status: She is alert.  Psychiatric:        Mood and Affect: Mood normal.     Labs reviewed: Basic Metabolic Panel: No results for input(s):  "NA", "K", "CL", "CO2", "GLUCOSE", "BUN", "CREATININE", "CALCIUM", "MG", "PHOS", "TSH" in the last 8760 hours. Liver Function Tests: No results for input(s): "AST", "ALT", "ALKPHOS", "BILITOT", "PROT", "ALBUMIN" in the last 8760 hours. No results for input(s): "LIPASE", "AMYLASE" in the last 8760 hours. No results for input(s): "AMMONIA" in the last 8760 hours. CBC: Recent Labs  03/19/21 1451 09/24/21 1347  WBC 8.9 8.5  NEUTROABS 6.1 5.8  HGB 12.4 13.2  HCT 38.7 39.9  MCV 99.0 97.3  PLT 544* 570*   Lipid Panel: No results for input(s): "CHOL", "HDL", "LDLCALC", "TRIG", "CHOLHDL", "LDLDIRECT" in the last 8760 hours. TSH: No results for input(s): "TSH" in the last 8760 hours. A1C: No results found for: "HGBA1C"   Assessment/Plan 1. Hx of TIA (transient ischemic attack) and stroke -stable, no recent events, continues on statin, asa and plavix  2. Gastroesophageal reflux disease without esophagitis -controlled on protonix  3. Essential thrombocytosis ([place]) Followed by oncology, continues on asa and hydroxyurea  4. Osteopenia, unspecified location -Recommended to take calcium 600 mg twice daily with Vitamin D 2000 units daily and weight bearing activity 30 mins/5 days a week  5. Seasonal allergies Continues on zyrtec PRN  6. Essential hypertension -Blood pressure well controlled, goal bp <140/90 Continue current medications and dietary modifications follow metabolic panel  7. Chronic cough Ongoing, likely exacerbated by allergies and smoking hx.  -chest xray was negative for acute processes   8. Hyperlipidemia, unspecified hyperlipidemia type -will follow up lipids on next labs -continue dietary modifications.   Carlos American. [place], [facility] [phone]

## 2022-01-24 DIAGNOSIS — H35341 Macular cyst, hole, or pseudohole, right eye: Secondary | ICD-10-CM | POA: Diagnosis not present

## 2022-01-24 DIAGNOSIS — H43813 Vitreous degeneration, bilateral: Secondary | ICD-10-CM | POA: Diagnosis not present

## 2022-01-24 DIAGNOSIS — Z961 Presence of intraocular lens: Secondary | ICD-10-CM | POA: Diagnosis not present

## 2022-01-24 DIAGNOSIS — Z0100 Encounter for examination of eyes and vision without abnormal findings: Secondary | ICD-10-CM | POA: Diagnosis not present

## 2022-01-28 DIAGNOSIS — Z0100 Encounter for examination of eyes and vision without abnormal findings: Secondary | ICD-10-CM | POA: Diagnosis not present

## 2022-03-29 ENCOUNTER — Other Ambulatory Visit: Payer: Self-pay | Admitting: Hematology and Oncology

## 2022-04-14 ENCOUNTER — Other Ambulatory Visit
Admission: RE | Admit: 2022-04-14 | Discharge: 2022-04-14 | Disposition: A | Discharge status: Home / Self Care | Payer: Medicare HMO | Source: Ambulatory Visit | Attending: Urgent Care | Admitting: Urgent Care

## 2022-04-14 ENCOUNTER — Ambulatory Visit
Admission: EM | Admit: 2022-04-14 | Discharge: 2022-04-14 | Disposition: A | Discharge status: Home / Self Care | Payer: Medicare HMO | Attending: Urgent Care | Admitting: Urgent Care

## 2022-04-14 DIAGNOSIS — I10 Essential (primary) hypertension: Secondary | ICD-10-CM | POA: Diagnosis not present

## 2022-04-14 DIAGNOSIS — J449 Chronic obstructive pulmonary disease, unspecified: Secondary | ICD-10-CM | POA: Insufficient documentation

## 2022-04-14 DIAGNOSIS — Z8673 Personal history of transient ischemic attack (TIA), and cerebral infarction without residual deficits: Secondary | ICD-10-CM | POA: Diagnosis not present

## 2022-04-14 DIAGNOSIS — D75839 Thrombocytosis, unspecified: Secondary | ICD-10-CM | POA: Insufficient documentation

## 2022-04-14 DIAGNOSIS — R059 Cough, unspecified: Secondary | ICD-10-CM | POA: Insufficient documentation

## 2022-04-14 DIAGNOSIS — R6889 Other general symptoms and signs: Secondary | ICD-10-CM

## 2022-04-14 DIAGNOSIS — Z1152 Encounter for screening for COVID-19: Secondary | ICD-10-CM | POA: Diagnosis not present

## 2022-04-14 DIAGNOSIS — J441 Chronic obstructive pulmonary disease with (acute) exacerbation: Secondary | ICD-10-CM

## 2022-04-14 LAB — RESP PANEL BY RT-PCR (RSV, FLU A&B, COVID)  RVPGX2
Influenza A by PCR: NEGATIVE
Influenza B by PCR: NEGATIVE
Resp Syncytial Virus by PCR: NEGATIVE
SARS Coronavirus 2 by RT PCR: NEGATIVE

## 2022-04-14 MED ORDER — BENZONATATE 100 MG PO CAPS: ORAL_CAPSULE | ORAL | 0 refills | Status: DC

## 2022-04-14 MED ORDER — AZITHROMYCIN 250 MG PO TABS: ORAL_TABLET | ORAL | 0 refills | Status: DC

## 2022-04-14 NOTE — ED Triage Notes (Signed)
Pt. Presents to UC w/ c/o a cough, congestion and a sore throat for 5 days.

## 2022-04-14 NOTE — ED Provider Notes (Signed)
Andrea Landry    CSN: [id number] Arrival date & time: 04/14/22  1130      History   Chief Complaint No chief complaint on file.   HPI Andrea Landry is a 81 y.o. female.   HPI  Patient presents to urgent care with complaint of cough, congestion, sore throat x 5 days.  She is particularly concerned about severe cough which keeps her awake at night and makes it difficult to sleep.  She states she is unable to lay flat.  She also is holding an emesis bag because the cough causes gagging and occasional episodes of vomiting  Past medical history is significant for thrombocytosis, TIA, hypertension.  Patient also endorses history of COPD which is not on her problem list.  Recent office visit with her primary care indicates he is aware of this diagnosis and awaiting confirmation from her previous healthcare provider.  Patient states her husband has COPD and she is concerned about him contracting what ever illness she may have.  Past Medical History:  Diagnosis Date   Abnormal nuclear stress test    Cancer [facility])    BREAST CANCER   DJD (degenerative joint disease)    Knees and elbows   DOE (dyspnea on exertion)    Fibrocystic breast disease    GERD (gastroesophageal reflux disease)    History of colon polyps    Removal of polyp July 2008   Hyperlipidemia    Hypertension    Osteopenia 03/2014   T score -1.4 FRAX 10%/1.8%. No change from prior DEXA   Rosacea    Sigmoid diverticulitis    SOB (shortness of breath)    TIA (transient ischemic attack)     Patient Active Problem List   Diagnosis Date Noted   Essential thrombocytosis ([place]) 08/18/2019   Abnormal weight loss 11/16/2018   Atypical lymphocytes present on peripheral blood smear 11/16/2018   Eustachian tube disorder 11/16/2018   Gastro-esophageal reflux disease without esophagitis 11/16/2018   Hammer toe 11/16/2018   Inflamed seborrheic keratosis 11/16/2018   Insomnia 11/16/2018   Long term current use of  therapeutic drug 11/16/2018   Loss of appetite 11/16/2018   Osteochondropathy 11/16/2018   Pain in limb 11/16/2018   Osteoarthritis of knee 11/16/2018   Osteoarthritis 11/16/2018   Temporomandibular joint disorder 11/16/2018   Tubular adenoma of colon 11/16/2018   Urinary tract infectious disease 11/16/2018   Dyspnea on exertion 06/08/2018   Left carotid bruit 06/08/2018   TIA (transient ischemic attack) 10/22/2016   Essential hypertension    Osteopenia    Cancer ([place])     Past Surgical History:  Procedure Laterality Date   BREAST ENHANCEMENT SURGERY     FOLLOWING MASTECTOMY FOR BREAST CANCER   BREAST LUMPECTOMY  1991   BREAST SURGERY     MULTIPLE BREAST BIOPSIES,    MASTECTOMY     RIGHT BREAST   right hammer toe and Bunion  05/07/2011    OB History     Gravida  3   Para  2   Term      Preterm  1   AB      Living  2      SAB      IAB      Ectopic      Multiple      Live Births               Home Medications    Prior to Admission medications   Medication Sig  Start Date End Date Taking? Authorizing Provider  amLODipine-valsartan (EXFORGE) 5-160 MG tablet Take 1 tablet by mouth daily.    [provider]  aspirin EC 81 MG tablet Take 1 tablet (81 mg total) by mouth daily. 08/18/19   Nicholas Lose, MD  atorvastatin (LIPITOR) 20 MG tablet Take 1 tablet (20 mg total) by mouth daily. 08/12/17   Narda Amber K, DO  cholecalciferol (VITAMIN D) 1000 units tablet Take 1 tablet by mouth daily    [provider]  clonazePAM (KLONOPIN) 0.5 MG tablet Take 1 tablet by mouth daily as needed for anxiety or sleep    [provider]  clopidogrel (PLAVIX) 75 MG tablet Take 1 tablet (75 mg total) by mouth daily. 08/11/17   Patel, Donika K, DO  hydroxyurea (HYDREA) 500 MG capsule TAKE 1 CAPSULE BY MOUTH EVERY DAY 03/29/22   Nicholas Lose, MD  loratadine (CLARITIN) 10 MG tablet Take 10 mg by mouth daily.    [provider]  Multiple  Vitamins-Minerals (MULTIVITAMIN ADULT) TABS Take 1 tablet by mouth daily    [provider]  pantoprazole (PROTONIX) 40 MG tablet Take 40 mg by mouth daily.  08/06/17   [provider]  triamterene-hydrochlorothiazide (MAXZIDE-25) 37.5-25 MG tablet Take 0.5 tablets by mouth daily.  06/19/16   [provider]  vitamin C (ASCORBIC ACID) 500 MG tablet Take 500 mg by mouth daily.    [provider]    Family History Family History  Problem Relation Age of Onset   Cancer Mother        RENAL   Other Father    Cerebral palsy Daughter    Healthy Son     Social History Social History   Tobacco Use   Smoking status: Former    Packs/day: 1.00    Years: 40.00    Total pack years: 40.00    Types: Cigarettes    Quit date: 1990    Years since quitting: 33.9   Smokeless tobacco: Never  Vaping Use   Vaping Use: Never used  Substance Use Topics   Alcohol use: Yes    Alcohol/week: 10.0 standard drinks of alcohol    Types: 10 Standard drinks or equivalent per week    Comment: 1-2 glasses wine per night   Drug use: No     Allergies   Lisinopril, Ace inhibitors, and Other   Review of Systems Review of Systems   Physical Exam Triage Vital Signs ED Triage Vitals  Enc Vitals Group     BP 04/14/22 1208 (!) 143/78     Pulse Rate 04/14/22 1208 (!) 103     Resp 04/14/22 1206 18     Temp 04/14/22 1206 99.6 F (37.6 C)     Temp Source 04/14/22 1206 Oral     SpO2 04/14/22 1208 93 %     Weight --      Height --      Head Circumference --      Peak Flow --      Pain Score --      Pain Loc --      Pain Edu? --      Excl. in [place]? --    No data found.  Updated Vital Signs BP (!) 143/78 (BP Location: Left Arm)   Pulse (!) 103   Temp 99.6 F (37.6 C) (Oral)   Resp 18   LMP 01/11/1996   SpO2 93%   Visual Acuity Right Eye Distance:   Left  Eye Distance:   Bilateral Distance:    Right Eye Near:   Left Eye Near:    Bilateral Near:      Physical Exam Vitals reviewed.  Constitutional:      Appearance: Normal appearance.  Cardiovascular:     Rate and Rhythm: Normal rate. Rhythm irregular.     Pulses: Normal pulses.     Heart sounds: Normal heart sounds.  Pulmonary:     Effort: Pulmonary effort is normal.     Breath sounds: Normal breath sounds. No wheezing, rhonchi or rales.  Skin:    General: Skin is warm and dry.  Neurological:     General: No focal deficit present.     Mental Status: She is alert and oriented to person, place, and time.  Psychiatric:        Mood and Affect: Mood normal.        Behavior: Behavior normal.      UC Treatments / Results  Labs (all labs ordered are listed, but only abnormal results are displayed) Labs Reviewed - No data to display  EKG   Radiology No results found.  Procedures Procedures (including critical care time)  Medications Ordered in UC Medications - No data to display  Initial Impression / Assessment and Plan / UC Course  I have reviewed the triage vital signs and the nursing notes.  Pertinent labs & imaging results that were available during my care of the patient were reviewed by me and considered in my medical decision making (see chart for details).   Patient is afebrile here without recent antipyretics. Satting 93% on room air. Overall is ill appearing, though well hydrated and without respiratory distress. Pulmonary exam is unremarkable.  Lungs CTAB without wheezes rales or rhonchi.  Suspect viral process given symptoms of 5 days.  Respiratory swab is obtained and will test for COVID, flu and RSV.  Patient endorses history of COPD and is at risk for poor outcome with RSV.  She also has an elderly spouse at home with COPD who would also be at significant risk for poor outcome.  Will prescribe azithromycin given her history of COPD along with benzonatate to treat her cough.  Patient is advised to seek evaluation in the ED if she develops worsening  respiratory symptoms.    Final Clinical Impressions(s) / UC Diagnoses   Final diagnoses:  None   Discharge Instructions   None    ED Prescriptions   None    PDMP not reviewed this encounter.   Rose Phi, [place] 04/14/22 1226

## 2022-04-14 NOTE — Discharge Instructions (Addendum)
Seek evaluation in the ED if you develop worsening respiratory symptoms. Follow up with your primary care provider if your symptoms are worsening or not improving.

## 2022-05-27 DIAGNOSIS — L299 Pruritus, unspecified: Secondary | ICD-10-CM | POA: Diagnosis not present

## 2022-05-27 DIAGNOSIS — D1801 Hemangioma of skin and subcutaneous tissue: Secondary | ICD-10-CM | POA: Diagnosis not present

## 2022-05-27 DIAGNOSIS — L821 Other seborrheic keratosis: Secondary | ICD-10-CM | POA: Diagnosis not present

## 2022-05-27 DIAGNOSIS — L814 Other melanin hyperpigmentation: Secondary | ICD-10-CM | POA: Diagnosis not present

## 2022-05-27 DIAGNOSIS — L853 Xerosis cutis: Secondary | ICD-10-CM | POA: Diagnosis not present

## 2022-05-29 ENCOUNTER — Other Ambulatory Visit: Payer: Self-pay | Admitting: Nurse Practitioner

## 2022-05-29 MED ORDER — TRIAMTERENE-HCTZ 37.5-25 MG PO TABS: 0.5000 | ORAL_TABLET | Freq: Every day | ORAL | 1 refills | Status: DC

## 2022-05-31 MED ORDER — TRIAMTERENE-HCTZ 37.5-25 MG PO TABS: 0.5000 | ORAL_TABLET | Freq: Every day | ORAL | 1 refills | Status: DC

## 2022-06-12 DIAGNOSIS — Z1231 Encounter for screening mammogram for malignant neoplasm of breast: Secondary | ICD-10-CM | POA: Diagnosis not present

## 2022-06-12 LAB — HM MAMMOGRAPHY

## 2022-06-25 ENCOUNTER — Encounter: Payer: Self-pay | Admitting: Nurse Practitioner

## 2022-06-26 MED ORDER — ATORVASTATIN CALCIUM 20 MG PO TABS: 20.0000 mg | ORAL_TABLET | Freq: Every day | ORAL | 0 refills | Status: DC

## 2022-08-20 ENCOUNTER — Other Ambulatory Visit: Payer: Self-pay | Admitting: Nurse Practitioner

## 2022-08-21 MED ORDER — AMLODIPINE BESYLATE-VALSARTAN 5-160 MG PO TABS: 1.0000 | ORAL_TABLET | Freq: Every day | ORAL | 1 refills | Status: DC

## 2022-08-29 ENCOUNTER — Encounter: Payer: Self-pay | Admitting: Nurse Practitioner

## 2022-08-29 ENCOUNTER — Ambulatory Visit (INDEPENDENT_AMBULATORY_CARE_PROVIDER_SITE_OTHER): Payer: Medicare HMO | Admitting: Nurse Practitioner

## 2022-08-29 VITALS — BP 124/76 | HR 78 | Temp 97.7°F | Ht 67.0 in | Wt 156.0 lb

## 2022-08-29 DIAGNOSIS — E2839 Other primary ovarian failure: Secondary | ICD-10-CM | POA: Diagnosis not present

## 2022-08-29 DIAGNOSIS — M8580 Other specified disorders of bone density and structure, unspecified site: Secondary | ICD-10-CM | POA: Diagnosis not present

## 2022-08-29 DIAGNOSIS — E785 Hyperlipidemia, unspecified: Secondary | ICD-10-CM | POA: Diagnosis not present

## 2022-08-29 DIAGNOSIS — Z0000 Encounter for general adult medical examination without abnormal findings: Secondary | ICD-10-CM

## 2022-08-29 DIAGNOSIS — I10 Essential (primary) hypertension: Secondary | ICD-10-CM | POA: Diagnosis not present

## 2022-08-29 DIAGNOSIS — D473 Essential (hemorrhagic) thrombocythemia: Secondary | ICD-10-CM | POA: Diagnosis not present

## 2022-08-29 LAB — HEPATIC FUNCTION PANEL
ALT: 18 U/L (ref 7–35)
AST: 22 (ref 13–35)
Alkaline Phosphatase: 86 (ref 25–125)
Bilirubin, Total: 0.5

## 2022-08-29 LAB — BASIC METABOLIC PANEL
BUN: 17 (ref 4–21)
CO2: 32 — AB (ref 13–22)
Chloride: 101 (ref 99–108)
Creatinine: 0.9 (ref 0.5–1.1)
Glucose: 85
Potassium: 4.9 mEq/L (ref 3.5–5.1)
Sodium: 139 (ref 137–147)

## 2022-08-29 LAB — COMPREHENSIVE METABOLIC PANEL
Albumin: 4.6 (ref 3.5–5.0)
Calcium: 9.7 (ref 8.7–10.7)
Globulin: 2.6
eGFR: 68

## 2022-08-29 LAB — LIPID PANEL
Cholesterol: 141 (ref 0–200)
HDL: 67 (ref 35–70)
LDL Cholesterol: 55
LDl/HDL Ratio: 2.1
Triglycerides: 108 (ref 40–160)

## 2022-08-29 LAB — CBC AND DIFFERENTIAL
HCT: 43 (ref 36–46)
Hemoglobin: 14.5 (ref 12.0–16.0)
Neutrophils Absolute: 5320
Platelets: 666 10*3/uL — AB (ref 150–400)
WBC: 7.6
WBC: 7.6

## 2022-08-29 LAB — CBC: RBC: 4.45 (ref 3.87–5.11)

## 2022-08-29 MED ORDER — CLONAZEPAM 0.5 MG PO TABS: ORAL_TABLET | ORAL | 5 refills | Status: DC

## 2022-08-29 NOTE — Progress Notes (Signed)
Subjective:   Andrea Landry is a 82 y.o. female who presents for Medicare Annual (Subsequent) preventive examination.  Review of Systems     Cardiac Risk Factors include: advanced age (>77men, >37 women);hypertension;dyslipidemia     Objective:    Today's Vitals   08/29/22 1054  BP: 124/76  Pulse: 78  Temp: 97.7 F (36.5 C)  SpO2: 94%  Weight: 156 lb (70.8 kg)  Height:  (1.702 m)   Body mass index is 24.43 kg/m.     08/29/2022   11:00 AM 08/29/2022   10:59 AM 01/10/2022    9:33 AM 12/10/2021    1:31 PM 11/08/2021    8:35 AM 08/25/2020    3:07 PM 08/20/2019    2:38 PM  Advanced Directives  Does Patient Have a Medical Advance Directive? Yes Yes Yes Yes Yes Yes Yes  Type of Advance Directive Out of facility DNR (pink MOST or yellow form);Healthcare Power of [employer] of [place];Out of facility DNR (pink MOST or yellow form) Out of facility DNR (pink MOST or yellow form) Healthcare Power of [place];Living will;Out of facility DNR (pink MOST or yellow form) Out of facility DNR (pink MOST or yellow form)    Does patient want to make changes to medical advance directive? No - Patient declined No - Patient declined No - Patient declined  No - Patient declined    Copy of Healthcare Power of Attorney in Chart? No - copy requested No - copy requested       Pre-existing out of facility DNR order (yellow form or pink MOST form)   Pink MOST form placed in chart (order not valid for inpatient use)  Pink MOST form placed in chart (order not valid for inpatient use)      Current Medications (verified) Outpatient Encounter Medications as of 08/29/2022  Medication Sig   amLODipine-valsartan (EXFORGE) 5-160 MG tablet Take 1 tablet by mouth daily.   aspirin EC 81 MG tablet Take 1 tablet (81 mg total) by mouth daily.   atorvastatin (LIPITOR) 20 MG tablet Take 1 tablet (20 mg total) by mouth daily.   cholecalciferol (VITAMIN D) 1000 units tablet Take 1 tablet by mouth daily    clopidogrel (PLAVIX) 75 MG tablet Take 1 tablet (75 mg total) by mouth daily.   hydroxyurea (HYDREA) 500 MG capsule TAKE 1 CAPSULE BY MOUTH EVERY DAY   loratadine (CLARITIN) 10 MG tablet Take 10 mg by mouth daily.   Multiple Vitamins-Minerals (MULTIVITAMIN ADULT) TABS Take 1 tablet by mouth daily   pantoprazole (PROTONIX) 40 MG tablet Take 40 mg by mouth daily.    triamterene-hydrochlorothiazide (MAXZIDE-25) 37.5-25 MG tablet Take 0.5 tablets by mouth daily.   vitamin C (ASCORBIC ACID) 500 MG tablet Take 500 mg by mouth daily.   [DISCONTINUED] clonazePAM (KLONOPIN) 0.5 MG tablet Take 1 tablet by mouth daily as needed for anxiety or sleep   clonazePAM (KLONOPIN) 0.5 MG tablet Take 1 tablet by mouth daily as needed for anxiety or sleep   [DISCONTINUED] azithromycin (ZITHROMAX Z-PAK) 250 MG tablet Take 2 tablets (500 mg) today, then 1 tablet (250 mg) for next 4 days.   [DISCONTINUED] benzonatate (TESSALON) 100 MG capsule Take 1-2 tablets 3 times a day as needed for cough   No facility-administered encounter medications on file as of 08/29/2022.    Allergies (verified) Lisinopril, Ace inhibitors, and Other   History: Past Medical History:  Diagnosis Date   Abnormal nuclear stress test    Cancer  BREAST CANCER   DJD (degenerative joint disease)    Knees and elbows   DOE (dyspnea on exertion)    Fibrocystic breast disease    GERD (gastroesophageal reflux disease)    History of colon polyps    Removal of polyp July 2008   Hyperlipidemia    Hypertension    Osteopenia 03/2014   T score -1.4 FRAX 10%/1.8%. No change from prior DEXA   Rosacea    Sigmoid diverticulitis    SOB (shortness of breath)    TIA (transient ischemic attack)    Past Surgical History:  Procedure Laterality Date   BREAST ENHANCEMENT SURGERY     FOLLOWING MASTECTOMY FOR BREAST CANCER   BREAST LUMPECTOMY  1991   BREAST SURGERY     MULTIPLE BREAST BIOPSIES,    MASTECTOMY     RIGHT BREAST   right hammer toe  and Bunion  05/07/2011   Family History  Problem Relation Age of Onset   Cancer Mother        RENAL   Other Father    Cerebral palsy Daughter    Healthy Son    Social History   Socioeconomic History   Marital status: Married    Spouse name: Not on file   Number of children: 2   Years of education: college   Highest education level: Not on file  Occupational History   Occupation: retired  Tobacco Use   Smoking status: Former    Packs/day: 1.00    Years: 40.00    Additional pack years: 0.00    Total pack years: 40.00    Types: Cigarettes    Quit date: 1990    Years since quitting: 34.3   Smokeless tobacco: Never  Vaping Use   Vaping Use: Never used  Substance and Sexual Activity   Alcohol use: Yes    Alcohol/week: 10.0 standard drinks of alcohol    Types: 10 Standard drinks or equivalent per week    Comment: 1-2 glasses wine per night   Drug use: No   Sexual activity: Not on file  Other Topics Concern   Not on file  Social History Narrative   Lives with husband in a 2 story home.  Has 2 children.     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     Education: college.    Right handed.   Social Determinants of Health   Financial Resource Strain: Not on file  Food Insecurity: Not on file  Transportation Needs: Not on file  Physical Activity: Not on file  Stress: Not on file  Social Connections: Not on file    Tobacco Counseling Counseling given: Not Answered   Clinical Intake:  Pre-visit preparation completed: Yes  Pain : No/denies pain     BMI - recorded: 24 Nutritional Status: BMI of 19-24  Normal Nutritional Risks: None Diabetes: No  How often do you need to have someone help you when you read instructions, pamphlets, or other written materials from your doctor or pharmacy?: 1 - Never  Diabetic?no         Activities of Daily Living    08/29/2022   11:14 AM  In your present state of health, do you have any difficulty performing the  following activities:  Hearing? 0  Vision? 0  Difficulty concentrating or making decisions? 0  Walking or climbing stairs? 0  Dressing or bathing? 0  Doing errands, shopping? 0  Preparing Food and eating ? N  Using the Toilet? N  In  the past six months, have you accidently leaked urine? Y  Do you have problems with loss of bowel control? Y  Managing your Medications? N  Managing your Finances? N  Housekeeping or managing your Housekeeping? N    Patient Care Team: [name], NP as PCP - General (Geriatric Medicine) Glendale Chard, DO as Consulting Physician (Neurology)  Indicate any recent Medical Services you may have received from other than [facility] providers in the past year (date may be approximate).     Assessment:   This is a routine wellness examination for Amberly.  Hearing/Vision screen Vision Screening - Comments:: [facility]. Last Eye Exam 02/2022  Dietary issues and exercise activities discussed: Current Exercise Habits: The patient does not participate in regular exercise at present   Goals Addressed             This Visit's Progress    Patient Stated       Maintain and stay active.        Depression Screen    08/29/2022   11:02 AM 11/08/2021   11:12 AM  PHQ 2/9 Scores  PHQ - 2 Score 0 0    Fall Risk    08/29/2022   11:02 AM 12/10/2021    1:31 PM 11/08/2021   11:12 AM 08/25/2020    3:07 PM 08/20/2019    2:38 PM  Fall Risk   Falls in the past year? 0 0 0 1 0  Number falls in past yr: 0 0 0 1 0  Injury with Fall? 0 0 0 0 0  Risk for fall due to :   No Fall Risks    Follow up   Falls evaluation completed      FALL RISK PREVENTION PERTAINING TO THE HOME:  Any stairs in or around the home? Yes  If so, are there any without handrails? No  Home free of loose throw rugs in walkways, pet beds, electrical cords, etc? Yes  Adequate lighting in your home to reduce risk of falls? Yes   ASSISTIVE DEVICES UTILIZED TO PREVENT FALLS:  Life  alert? No  Use of a cane, walker or w/c? No  Grab bars in the bathroom? Yes  Shower chair or bench in shower? Yes  Elevated toilet seat or a handicapped toilet? Yes   TIMED UP AND GO:  Was the test performed? No .    Cognitive Function:        08/29/2022   11:02 AM  6CIT Screen  What Year? 0 points  What month? 0 points  What time? 0 points  Count back from 20 0 points  Months in reverse 0 points  Repeat phrase 0 points  Total Score 0 points    Immunizations Immunization History  Administered Date(s) Administered   DTP 02/12/2006   Influenza Inj Mdck Quad Pf 02/22/2017, 01/26/2018   Influenza Split 02/20/2011, 02/04/2012, 02/26/2013, 01/21/2014, 02/10/2015, 03/01/2016   Influenza-Unspecified 01/18/2022   PFIZER(Purple Top)SARS-COV-2 Vaccination 05/20/2019, 06/08/2019, 02/14/2020, 09/21/2020   Pfizer Covid-19 Vaccine Bivalent Booster 65yrs & up 01/25/2021   Pneumococcal Conjugate-13 05/02/2014   Pneumococcal Polysaccharide-23 03/18/2007, 08/04/2019   Tdap 06/07/2011   Zoster Recombinat (Shingrix) 04/18/2021, 07/04/2021   Zoster, Live 02/15/2007, 07/11/2021    TDAP status: Due, Education has been provided regarding the importance of this vaccine. Advised may receive this vaccine at local pharmacy or Health Dept. Aware to provide a copy of the vaccination record if obtained from local pharmacy or Health Dept. Verbalized acceptance and  understanding.  Flu Vaccine status: Up to date  Pneumococcal vaccine status: Up to date  Covid-19 vaccine status: Information provided on how to obtain vaccines.   Qualifies for Shingles Vaccine? Yes   Zostavax completed No   Shingrix Completed?: Yes  Screening Tests Health Maintenance  Topic Date Due   DEXA SCAN  03/14/2016   DTaP/Tdap/Td (3 - Td or Tdap) 06/06/2021   COVID-19 Vaccine (6 - 2023-24 season) 01/04/2022   INFLUENZA VACCINE  12/05/2022   Medicare Annual Wellness (AWV)  08/29/2023   Pneumonia Vaccine 1+ Years old   Completed   Zoster Vaccines- Shingrix  Completed   HPV VACCINES  Aged Out    Health Maintenance  Health Maintenance Due  Topic Date Due   DEXA SCAN  03/14/2016   DTaP/Tdap/Td (3 - Td or Tdap) 06/06/2021   COVID-19 Vaccine (6 - 2023-24 season) 01/04/2022    Colorectal cancer screening: No longer required.   Mammogram status: No longer required due to age.  Bone Density status: Ordered today. Pt provided with contact info and advised to call to schedule appt.  Lung Cancer Screening: (Low Dose CT Chest recommended if Age 65-80 years, 30 pack-year currently smoking OR have quit w/in 15years.) does not qualify.   Lung Cancer Screening Referral: na  Additional Screening:  Hepatitis C Screening: does not qualify  Vision Screening: Recommended annual ophthalmology exams for early detection of glaucoma and other disorders of the eye. Is the patient up to date with their annual eye exam?  Yes  Who is the provider or what is the name of the office in which the patient attends annual eye exams? [facility] If pt is not established with a provider, would they like to be referred to a provider to establish care? No .   Dental Screening: Recommended annual dental exams for proper oral hygiene  Community Resource Referral / Chronic Care Management: CRR required this visit?  No   CCM required this visit?  No      Plan:     I have personally reviewed and noted the following in the patient's chart:   Medical and social history Use of alcohol, tobacco or illicit drugs  Current medications and supplements including opioid prescriptions. Patient is not currently taking opioid prescriptions. Functional ability and status Nutritional status Physical activity Advanced directives List of other physicians Hospitalizations, surgeries, and ER visits in previous 12 months Vitals Screenings to include cognitive, depression, and falls Referrals and appointments  In addition, I have  reviewed and discussed with patient certain preventive protocols, quality metrics, and best practice recommendations. A written personalized care plan for preventive services as well as general preventive health recommendations were provided to patient.     [name], NP   08/29/2022   Place of service: [facility]

## 2022-08-29 NOTE — Patient Instructions (Addendum)
  Andrea Landry , Thank you for taking time to come for your Medicare Wellness Visit. I appreciate your ongoing commitment to your health goals. Please review the following plan we discussed and let me know if I can assist you in the future.   These are the goals we discussed:  Goals      Patient Stated     Maintain and stay active.         This is a list of the screening recommended for you and due dates:  Health Maintenance  Topic Date Due   DEXA scan (bone density measurement)  03/14/2016   DTaP/Tdap/Td vaccine (3 - Td or Tdap) 06/06/2021   COVID-19 Vaccine (6 - 2023-24 season) 01/04/2022   Flu Shot  12/05/2022   Medicare Annual Wellness Visit  08/29/2023   Pneumonia Vaccine  Completed   Zoster (Shingles) Vaccine  Completed   HPV Vaccine  Aged Out

## 2022-09-20 ENCOUNTER — Other Ambulatory Visit: Payer: Self-pay | Admitting: Nurse Practitioner

## 2022-09-24 NOTE — Progress Notes (Signed)
Patient Care Team: [name], NP as PCP - General (Geriatric Medicine) Glendale Chard, DO as Consulting Physician (Neurology)  DIAGNOSIS: No diagnosis found.  SUMMARY OF ONCOLOGIC HISTORY: Oncology History   No history exists.    CHIEF COMPLIANT:  Follow-up of thrombocytosis    INTERVAL HISTORY: Andrea Landry is a 81y.o. with above-mentioned history of thrombocytosis currently on aspirin 81mg  daily and hydroxyurea. She presents to the clinic today for follow-up.    ALLERGIES:  is allergic to lisinopril, ace inhibitors, and other.  MEDICATIONS:  Current Outpatient Medications  Medication Sig Dispense Refill   amLODipine-valsartan (EXFORGE) 5-160 MG tablet Take 1 tablet by mouth daily. 90 tablet 1   aspirin EC 81 MG tablet Take 1 tablet (81 mg total) by mouth daily.     atorvastatin (LIPITOR) 20 MG tablet TAKE 1 TABLET BY MOUTH EVERY DAY 90 tablet 1   cholecalciferol (VITAMIN D) 1000 units tablet Take 1 tablet by mouth daily     clonazePAM (KLONOPIN) 0.5 MG tablet Take 1 tablet by mouth daily as needed for anxiety or sleep 30 tablet 5   clopidogrel (PLAVIX) 75 MG tablet Take 1 tablet (75 mg total) by mouth daily. 90 tablet 3   hydroxyurea (HYDREA) 500 MG capsule TAKE 1 CAPSULE BY MOUTH EVERY DAY 90 capsule 1   loratadine (CLARITIN) 10 MG tablet Take 10 mg by mouth daily.     Multiple Vitamins-Minerals (MULTIVITAMIN ADULT) TABS Take 1 tablet by mouth daily     pantoprazole (PROTONIX) 40 MG tablet Take 40 mg by mouth daily.   11   triamterene-hydrochlorothiazide (MAXZIDE-25) 37.5-25 MG tablet Take 0.5 tablets by mouth daily. 90 tablet 1   vitamin C (ASCORBIC ACID) 500 MG tablet Take 500 mg by mouth daily.     No current facility-administered medications for this visit.    PHYSICAL EXAMINATION: ECOG PERFORMANCE STATUS: {CHL ONC ECOG [id number]}  There were no vitals filed for this visit. There were no vitals filed for this visit.  BREAST:*** No palpable  masses or nodules in either right or left breasts. No palpable axillary supraclavicular or infraclavicular adenopathy no breast tenderness or nipple discharge. (exam performed in the presence of a chaperone)  LABORATORY DATA:  I have reviewed the data as listed    Latest Ref Rng & Units 08/29/2022   12:00 AM 08/21/2021   12:00 AM 08/03/2020   12:00 AM  CMP  BUN 4 - 21 17  16  16    Creatinine 0.5 - 1.1 0.9  0.8  0.8   Sodium 137 - 147 139  141  141   Potassium 3.5 - 5.1 mEq/L 4.9  3.7  4.2   Chloride 99 - 108 101  104  103   CO2 13 - 22 32  31  31   Calcium 8.7 - 10.7 9.7  9.8  10.2   Alkaline Phos 25 - 125 86     AST 13 - 35 22  19  21    ALT 7 - 35 U/L 18  16  21      Lab Results  Component Value Date   WBC 7.6 08/29/2022   WBC 7.6 08/29/2022   HGB 14.5 08/29/2022   HCT 43 08/29/2022   MCV 97.3 09/24/2021   PLT 666 (A) 08/29/2022   NEUTROABS 5,320.00 08/29/2022    ASSESSMENT & PLAN:  No problem-specific Assessment & Plan notes found for this encounter.    No orders of the defined types were placed  in this encounter.  The patient has a good understanding of the overall plan. she agrees with it. she will call with any problems that may develop before the next visit here. Total time spent: 30 mins including face to face time and time spent for planning, charting and co-ordination of care   Sherlyn Lick, CMA 09/24/22    I Janan Ridge am acting as a Neurosurgeon for [employer]  ***

## 2022-09-25 ENCOUNTER — Other Ambulatory Visit: Payer: Self-pay | Admitting: *Deleted

## 2022-09-25 DIAGNOSIS — D473 Essential (hemorrhagic) thrombocythemia: Secondary | ICD-10-CM

## 2022-09-26 ENCOUNTER — Inpatient Hospital Stay: Payer: Medicare HMO | Attending: Hematology and Oncology

## 2022-09-26 ENCOUNTER — Inpatient Hospital Stay (HOSPITAL_BASED_OUTPATIENT_CLINIC_OR_DEPARTMENT_OTHER): Payer: Medicare HMO | Admitting: Hematology and Oncology

## 2022-09-26 VITALS — BP 110/52 | HR 71 | Temp 98.3°F | Wt 156.6 lb

## 2022-09-26 DIAGNOSIS — D75839 Thrombocytosis, unspecified: Secondary | ICD-10-CM | POA: Insufficient documentation

## 2022-09-26 DIAGNOSIS — D473 Essential (hemorrhagic) thrombocythemia: Secondary | ICD-10-CM | POA: Insufficient documentation

## 2022-09-26 LAB — CBC WITH DIFFERENTIAL (CANCER CENTER ONLY)
Abs Immature Granulocytes: 0.05 10*3/uL (ref 0.00–0.07)
Basophils Absolute: 0.1 10*3/uL (ref 0.0–0.1)
Basophils Relative: 1 %
Eosinophils Absolute: 0.2 10*3/uL (ref 0.0–0.5)
Eosinophils Relative: 3 %
HCT: 39.5 % (ref 36.0–46.0)
Hemoglobin: 13.6 g/dL (ref 12.0–15.0)
Immature Granulocytes: 1 %
Lymphocytes Relative: 20 %
Lymphs Abs: 1.6 10*3/uL (ref 0.7–4.0)
MCH: 32.8 pg (ref 26.0–34.0)
MCHC: 34.4 g/dL (ref 30.0–36.0)
MCV: 95.2 fL (ref 80.0–100.0)
Monocytes Absolute: 0.7 10*3/uL (ref 0.1–1.0)
Monocytes Relative: 8 %
Neutro Abs: 5.6 10*3/uL (ref 1.7–7.7)
Neutrophils Relative %: 67 %
Platelet Count: 604 10*3/uL — ABNORMAL HIGH (ref 150–400)
RBC: 4.15 MIL/uL (ref 3.87–5.11)
RDW: 13.5 % (ref 11.5–15.5)
WBC Count: 8.3 10*3/uL (ref 4.0–10.5)
nRBC: 0 % (ref 0.0–0.2)

## 2022-09-26 NOTE — Assessment & Plan Note (Signed)
07/31/2016: hemoglobin 15.3, Platelet count 657 02/02/2020: Hemoglobin 13.1, platelets 927 03/15/20: Hb 12.2, Pl 609 05/15/2020: Hemoglobin 13.7, platelets 471 08/14/2020: Hemoglobin 13.4, platelets 561 11/13/2020: Hemoglobin 14.3, platelets 516 03/19/2021: Hemoglobin 12.4, platelets 544 09/24/2021: Hemoglobin 13.2, platelets 570 09/26/2022::   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   Treatment plan: 1. Aspirin 81 mg daily started April 2021 2. hydroxyurea 500 mg daily switched to 4 days a week on 08/14/2020   Hydrea toxicity: Hair thinning She is going to [place] and going to see [facility] from them.   Responding to hydrea and tolerating it well. Return to clinic in 1 year with labs and follow-up

## 2022-10-29 DIAGNOSIS — M8588 Other specified disorders of bone density and structure, other site: Secondary | ICD-10-CM | POA: Diagnosis not present

## 2022-10-29 LAB — HM DEXA SCAN

## 2022-10-30 ENCOUNTER — Telehealth: Payer: Medicare HMO | Admitting: Nurse Practitioner

## 2022-10-30 NOTE — Telephone Encounter (Signed)
-----   Message from Dicky Doe, [place] sent at 10/30/2022  8:28 AM EDT ----- What does this say?

## 2022-10-30 NOTE — Telephone Encounter (Signed)
MyChart message sent to patient.

## 2022-10-30 NOTE — Telephone Encounter (Signed)
Pt has osteopenia. Recommended to take calcium 600 mg twice daily with Vitamin D 2000 units daily and weight bearing activity 30 mins/5 days a week To follow up bone density in 2 years

## 2022-12-15 ENCOUNTER — Other Ambulatory Visit: Payer: Self-pay | Admitting: Hematology and Oncology

## 2022-12-16 ENCOUNTER — Other Ambulatory Visit: Payer: Self-pay | Admitting: Nurse Practitioner

## 2022-12-23 ENCOUNTER — Other Ambulatory Visit: Payer: Self-pay

## 2022-12-23 ENCOUNTER — Encounter: Payer: Self-pay | Admitting: Nurse Practitioner

## 2022-12-23 MED ORDER — PANTOPRAZOLE SODIUM 40 MG PO TBEC: 40.0000 mg | DELAYED_RELEASE_TABLET | Freq: Every day | ORAL | 1 refills | Status: DC

## 2023-01-14 ENCOUNTER — Encounter: Payer: Self-pay | Admitting: Nurse Practitioner

## 2023-01-14 ENCOUNTER — Telehealth: Payer: Self-pay | Admitting: *Deleted

## 2023-01-14 ENCOUNTER — Telehealth (INDEPENDENT_AMBULATORY_CARE_PROVIDER_SITE_OTHER): Payer: Medicare HMO | Admitting: Nurse Practitioner

## 2023-01-14 ENCOUNTER — Ambulatory Visit: Payer: Medicare HMO | Admitting: Nurse Practitioner

## 2023-01-14 DIAGNOSIS — U071 COVID-19: Secondary | ICD-10-CM

## 2023-01-14 MED ORDER — NIRMATRELVIR/RITONAVIR (PAXLOVID)TABLET: 3.0000 | ORAL_TABLET | Freq: Two times a day (BID) | ORAL | 0 refills | Status: AC

## 2023-01-14 NOTE — Progress Notes (Signed)
Careteam: Patient Care Team: [name], NP as PCP - General (Geriatric Medicine) Glendale Chard, DO as Consulting Physician (Neurology)  Advanced Directive information Does Patient Have a Medical Advance Directive?: Yes, Type of Advance Directive: Out of facility DNR (pink MOST or yellow form), Does patient want to make changes to medical advance directive?: No - Patient declined  Allergies  Allergen Reactions   Lisinopril Swelling   Ace Inhibitors     Other reaction(s): mild angioedema   Other Other (See Comments)    Chief Complaint  Patient presents with   Acute Visit    Positive Covid. Tested yesterday. Requesting Paxlovid.      HPI: Patient is a 82 y.o. female for positive COVID yesterday.  Feeling pretty bad. Coughing, sore throat, headache.  Nonproductive cough.  Some nasal congestion No chest congestion No shortness of breath or chest pains.  Unknown fever but has body aches and chills.    Review of Systems:  Review of Systems  Constitutional:  Positive for chills and malaise/fatigue. Negative for fever and weight loss.  HENT:  Positive for congestion and sore throat. Negative for tinnitus.   Respiratory:  Positive for cough. Negative for sputum production and shortness of breath.   Cardiovascular:  Negative for chest pain, palpitations and leg swelling.  Gastrointestinal:  Positive for nausea. Negative for abdominal pain, constipation, diarrhea and vomiting.  Genitourinary:  Negative for dysuria, frequency and urgency.  Musculoskeletal:  Negative for back pain, falls, joint pain and myalgias.  Skin: Negative.   Neurological:  Positive for headaches. Negative for dizziness.    Past Medical History:  Diagnosis Date   Abnormal nuclear stress test    Cancer [facility])    BREAST CANCER   DJD (degenerative joint disease)    Knees and elbows   DOE (dyspnea on exertion)    Fibrocystic breast disease    GERD (gastroesophageal reflux disease)    History  of colon polyps    Removal of polyp July 2008   Hyperlipidemia    Hypertension    Osteopenia 03/2014   T score -1.4 FRAX 10%/1.8%. No change from prior DEXA   Rosacea    Sigmoid diverticulitis    SOB (shortness of breath)    TIA (transient ischemic attack)    Past Surgical History:  Procedure Laterality Date   BREAST ENHANCEMENT SURGERY     FOLLOWING MASTECTOMY FOR BREAST CANCER   BREAST LUMPECTOMY  1991   BREAST SURGERY     MULTIPLE BREAST BIOPSIES,    MASTECTOMY     RIGHT BREAST   right hammer toe and Bunion  05/07/2011   Social History:   reports that she quit smoking about 34 years ago. Her smoking use included cigarettes. She started smoking about 74 years ago. She has a 40 pack-year smoking history. She has never used smokeless tobacco. She reports current alcohol use of about 10.0 standard drinks of alcohol per week. She reports that she does not use drugs.  Family History  Problem Relation Age of Onset   Cancer Mother        RENAL   Other Father    Cerebral palsy Daughter    Healthy Son     Medications: Patient's Medications  New Prescriptions   No medications on file  Previous Medications   AMLODIPINE-VALSARTAN (EXFORGE) 5-160 MG TABLET    Take 1 tablet by mouth daily.   ASPIRIN EC 81 MG TABLET    Take 1 tablet (81 mg total)  by mouth daily.   ATORVASTATIN (LIPITOR) 20 MG TABLET    TAKE 1 TABLET BY MOUTH EVERY DAY   CHOLECALCIFEROL (VITAMIN D) 1000 UNITS TABLET    Take 1 tablet by mouth daily   CLONAZEPAM (KLONOPIN) 0.5 MG TABLET    Take 1 tablet by mouth daily as needed for anxiety or sleep   CLOPIDOGREL (PLAVIX) 75 MG TABLET    Take 1 tablet (75 mg total) by mouth daily.   HYDROXYUREA (HYDREA) 500 MG CAPSULE    Take 1 capsule (500 mg total) by mouth 4 (four) times a week.   LORATADINE (CLARITIN) 10 MG TABLET    Take 10 mg by mouth daily.   MULTIPLE VITAMINS-MINERALS (MULTIVITAMIN ADULT) TABS    Take 1 tablet by mouth daily   PANTOPRAZOLE (PROTONIX) 40 MG  TABLET    Take 1 tablet (40 mg total) by mouth daily.   TRIAMTERENE-HYDROCHLOROTHIAZIDE (MAXZIDE-25) 37.5-25 MG TABLET    Take 0.5 tablets by mouth daily.   VITAMIN C (ASCORBIC ACID) 500 MG TABLET    Take 500 mg by mouth daily.  Modified Medications   No medications on file  Discontinued Medications   No medications on file    Physical Exam:  There were no vitals filed for this visit. There is no height or weight on file to calculate BMI. Wt Readings from Last 3 Encounters:  09/26/22 156 lb 9.6 oz (71 kg)  08/29/22 156 lb (70.8 kg)  12/10/21 161 lb (73 kg)    Physical Exam Constitutional:      Appearance: Normal appearance.  Pulmonary:     Effort: Pulmonary effort is normal.  Neurological:     Mental Status: She is alert. Mental status is at baseline.  Psychiatric:        Mood and Affect: Mood normal.     Labs reviewed: Basic Metabolic Panel: Recent Labs    08/29/22 0000  NA 139  K 4.9  CL 101  CO2 32*  BUN 17  CREATININE 0.9  CALCIUM 9.7   Liver Function Tests: Recent Labs    08/29/22 0000  AST 22  ALT 18  ALKPHOS 86  ALBUMIN 4.6   No results for input(s): "LIPASE", "AMYLASE" in the last 8760 hours. No results for input(s): "AMMONIA" in the last 8760 hours. CBC: Recent Labs    08/29/22 0000 09/26/22 1440  WBC 7.6  7.6 8.3  NEUTROABS 5,320.00 5.6  HGB 14.5 13.6  HCT 43 39.5  MCV  --  95.2  PLT 666* 604*   Lipid Panel: Recent Labs    08/29/22 0000  CHOL 141  HDL 67  LDLCALC 55  TRIG 108   TSH: No results for input(s): "TSH" in the last 8760 hours. A1C: No results found for: "HGBA1C"   Assessment/Plan COVID-19 Netipot or saline wash daily Plain nasal saline spray throughout the day as needed May use tylenol 325 mg 2 tablets every 6-8 hours as needed aches and pains or sore throat humidifier in the home to help with the dry air Mucinex DM by mouth twice daily as needed for cough and chest congestion with full glass of water  Keep  well hydrated Proper nutrition  Avoid forcefully blowing nose Vit c 1000 mg twice daily Vit d 2000 units daily  - nirmatrelvir/ritonavir (PAXLOVID) 20 x 150 MG & 10 x 100MG  TABS; Take 3 tablets by mouth 2 (two) times daily for 5 days. (Take nirmatrelvir 150 mg two tablets twice daily for 5 days and ritonavir  100 mg one tablet twice daily for 5 days) Patient GFR is 68  Dispense: 30 tablet; Refill: 0 -to hold lipitor while on paxlovid -strict follow up precautions given.   Janene Harvey. Biagio Borg  [facility] [phone]    Virtual Visit via video  I connected with patient on 01/14/23 at  9:00 AM EDT by mychart and verified that I am speaking with the correct person using two identifiers.  Location: Patient: home Provider: [place]   I discussed the limitations, risks, security and privacy concerns of performing an evaluation and management service by telephone and the availability of in person appointments. I also discussed with the patient that there may be a patient responsible charge related to this service. The patient expressed understanding and agreed to proceed.   I discussed the assessment and treatment plan with the patient. The patient was provided an opportunity to ask questions and all were answered. The patient agreed with the plan and demonstrated an understanding of the instructions.   The patient was advised to call back or seek an in-person evaluation if the symptoms worsen or if the condition fails to improve as anticipated.  I provided 15 minutes of non-face-to-face time during this encounter.  Janene Harvey. Biagio Borg [facility] printed and mailed

## 2023-01-14 NOTE — Progress Notes (Signed)
  This service is provided via telemedicine  No vital signs collected/recorded due to the encounter was a telemedicine visit.   Location of patient (ex: home, work):  Home  Patient consents to a telephone visit:  Yes  Location of the provider (ex: office, home):  Office [place].   Name of any referring provider:  na  Names of all persons participating in the telemedicine service and their role in the encounter:  Ricki Rodriguez, Patient, Nelda Severe, CMA, Abbey Chatters, NP  Time spent on call:  7:12

## 2023-01-14 NOTE — Telephone Encounter (Signed)
Ms. Andrea Landry, Andrea Landry are scheduled for a virtual visit with your provider today.    Just as we do with appointments in the office, we must obtain your consent to participate.  Your consent will be active for this visit and any virtual visit you Andrea Landry have with one of our providers in the next 365 days.    If you have a MyChart account, I can also send a copy of this consent to you electronically.  All virtual visits are billed to your insurance company just like a traditional visit in the office.  As this is a virtual visit, video technology does not allow for your provider to perform a traditional examination.  This Andrea Landry limit your provider's ability to fully assess your condition.  If your provider identifies any concerns that need to be evaluated in person or the need to arrange testing such as labs, EKG, etc, we will make arrangements to do so.    Although advances in technology are sophisticated, we cannot ensure that it will always work on either your end or our end.  If the connection with a video visit is poor, we Andrea Landry have to switch to a telephone visit.  With either a video or telephone visit, we are not always able to ensure that we have a secure connection.   I need to obtain your verbal consent now.   Are you willing to proceed with your visit today?   Andrea Landry has provided verbal consent on 01/14/2023 for a virtual visit (video or telephone).   Andrea Landry, [place] 01/14/2023  9:07 AM

## 2023-01-23 ENCOUNTER — Ambulatory Visit: Payer: Medicare HMO | Admitting: Nurse Practitioner

## 2023-01-23 ENCOUNTER — Encounter: Payer: Self-pay | Admitting: Nurse Practitioner

## 2023-01-23 VITALS — BP 128/72 | HR 70 | Temp 97.3°F | Ht 67.0 in | Wt 154.0 lb

## 2023-01-23 DIAGNOSIS — E785 Hyperlipidemia, unspecified: Secondary | ICD-10-CM

## 2023-01-23 DIAGNOSIS — M8580 Other specified disorders of bone density and structure, unspecified site: Secondary | ICD-10-CM

## 2023-01-23 DIAGNOSIS — I10 Essential (primary) hypertension: Secondary | ICD-10-CM

## 2023-01-23 DIAGNOSIS — K219 Gastro-esophageal reflux disease without esophagitis: Secondary | ICD-10-CM

## 2023-01-23 DIAGNOSIS — D473 Essential (hemorrhagic) thrombocythemia: Secondary | ICD-10-CM

## 2023-01-23 NOTE — Progress Notes (Signed)
Careteam: Patient Care Team: Andrea Seller, NP as PCP - General (Geriatric Medicine) Glendale Chard, DO as Consulting Physician (Neurology)  PLACE OF SERVICE:  [facility]  Advanced Directive information Does Patient Have a Medical Advance Directive?: Yes, Type of Advance Directive: Out of facility DNR (pink MOST or yellow form), Does patient want to make changes to medical advance directive?: No - Patient declined  Allergies  Allergen Reactions   Lisinopril Swelling   Ace Inhibitors     Other reaction(s): mild angioedema   Other Other (See Comments)    Chief Complaint  Patient presents with   Medical Management of Chronic Issues    Medical Management of Chronic Issues. Yearly Follow up   Quality Metric Gaps    To discuss need for Flu and Covid     HPI: Patient is a 82 y.o. female for routine follow up.   Doing well after COVID. First day was first good day.  Plans to get flu shot at [facility].   Htn- well controlled on amlodipine- valsartan, triamterene-hydrochlorothiazide half tablet.   Followed by hematology for elevated plates, continues on hydroxyurea 4 times weekly.   GERD- well controlled on protonix.   Using sleep aid from [employer]- unsure the active ingredient but sleeping well.   Review of Systems:  Review of Systems  Constitutional:  Negative for chills, fever and weight loss.  HENT:  Negative for tinnitus.   Respiratory:  Negative for cough, sputum production and shortness of breath.   Cardiovascular:  Negative for chest pain, palpitations and leg swelling.  Gastrointestinal:  Negative for abdominal pain, constipation, diarrhea and heartburn.  Genitourinary:  Negative for dysuria, frequency and urgency.  Musculoskeletal:  Negative for back pain, falls, joint pain and myalgias.  Skin: Negative.   Neurological:  Negative for dizziness and headaches.  Psychiatric/Behavioral:  Negative for depression and memory loss. The patient does not have  insomnia.     Past Medical History:  Diagnosis Date   Abnormal nuclear stress test    Cancer [facility])    BREAST CANCER   DJD (degenerative joint disease)    Knees and elbows   DOE (dyspnea on exertion)    Fibrocystic breast disease    GERD (gastroesophageal reflux disease)    History of colon polyps    Removal of polyp July 2008   Hyperlipidemia    Hypertension    Osteopenia 03/2014   T score -1.4 FRAX 10%/1.8%. No change from prior DEXA   Rosacea    Sigmoid diverticulitis    SOB (shortness of breath)    TIA (transient ischemic attack)    Past Surgical History:  Procedure Laterality Date   BREAST ENHANCEMENT SURGERY     FOLLOWING MASTECTOMY FOR BREAST CANCER   BREAST LUMPECTOMY  1991   BREAST SURGERY     MULTIPLE BREAST BIOPSIES,    MASTECTOMY     RIGHT BREAST   right hammer toe and Bunion  05/07/2011   Social History:   reports that she quit smoking about 34 years ago. Her smoking use included cigarettes. She started smoking about 74 years ago. She has a 40 pack-year smoking history. She has never used smokeless tobacco. She reports current alcohol use of about 10.0 standard drinks of alcohol per week. She reports that she does not use drugs.  Family History  Problem Relation Age of Onset   Cancer Mother        RENAL   Other Father    Cerebral  palsy Daughter    Healthy Son     Medications: Patient's Medications  New Prescriptions   No medications on file  Previous Medications   AMLODIPINE-VALSARTAN (EXFORGE) 5-160 MG TABLET    Take 1 tablet by mouth daily.   ASPIRIN EC 81 MG TABLET    Take 1 tablet (81 mg total) by mouth daily.   ATORVASTATIN (LIPITOR) 20 MG TABLET    TAKE 1 TABLET BY MOUTH EVERY DAY   CHOLECALCIFEROL (VITAMIN D) 1000 UNITS TABLET    Take 1 tablet by mouth daily   CLONAZEPAM (KLONOPIN) 0.5 MG TABLET    Take 1 tablet by mouth daily as needed for anxiety or sleep   CLOPIDOGREL (PLAVIX) 75 MG TABLET    Take 1 tablet (75 mg total) by mouth daily.    HYDROXYUREA (HYDREA) 500 MG CAPSULE    Take 1 capsule (500 mg total) by mouth 4 (four) times a week.   LORATADINE (CLARITIN) 10 MG TABLET    Take 10 mg by mouth daily.   MULTIPLE VITAMINS-MINERALS (MULTIVITAMIN ADULT) TABS    Take 1 tablet by mouth daily   PANTOPRAZOLE (PROTONIX) 40 MG TABLET    Take 1 tablet (40 mg total) by mouth daily.   TRIAMTERENE-HYDROCHLOROTHIAZIDE (MAXZIDE-25) 37.5-25 MG TABLET    Take 0.5 tablets by mouth daily.   VITAMIN C (ASCORBIC ACID) 500 MG TABLET    Take 500 mg by mouth daily.  Modified Medications   No medications on file  Discontinued Medications   No medications on file    Physical Exam:  Vitals:   01/23/23 0856  BP: 128/72  Pulse: 70  Temp: (!) 97.3 F (36.3 C)  SpO2: 93%  Weight: 154 lb (69.9 kg)  Height: 5\' 7"  (1.702 m)   Body mass index is 24.12 kg/m. Wt Readings from Last 3 Encounters:  01/23/23 154 lb (69.9 kg)  09/26/22 156 lb 9.6 oz (71 kg)  08/29/22 156 lb (70.8 kg)    Physical Exam Constitutional:      General: She is not in acute distress.    Appearance: She is well-developed. She is not diaphoretic.  HENT:     Head: Normocephalic and atraumatic.     Mouth/Throat:     Pharynx: No oropharyngeal exudate.  Eyes:     Conjunctiva/sclera: Conjunctivae normal.     Pupils: Pupils are equal, round, and reactive to light.  Cardiovascular:     Rate and Rhythm: Normal rate and regular rhythm.     Heart sounds: Normal heart sounds.  Pulmonary:     Effort: Pulmonary effort is normal.     Breath sounds: Normal breath sounds.  Abdominal:     General: Bowel sounds are normal.     Palpations: Abdomen is soft.  Musculoskeletal:     Cervical back: Normal range of motion and neck supple.     Right lower leg: No edema.     Left lower leg: No edema.  Skin:    General: Skin is warm and dry.  Neurological:     Mental Status: She is alert.  Psychiatric:        Mood and Affect: Mood normal.     Labs reviewed: Basic Metabolic  Panel: Recent Labs    08/29/22 0000  NA 139  K 4.9  CL 101  CO2 32*  BUN 17  CREATININE 0.9  CALCIUM 9.7   Liver Function Tests: Recent Labs    08/29/22 0000  AST 22  ALT 18  ALKPHOS 86  ALBUMIN 4.6   No results for input(s): "LIPASE", "AMYLASE" in the last 8760 hours. No results for input(s): "AMMONIA" in the last 8760 hours. CBC: Recent Labs    08/29/22 0000 09/26/22 1440  WBC 7.6  7.6 8.3  NEUTROABS 5,320.00 5.6  HGB 14.5 13.6  HCT 43 39.5  MCV  --  95.2  PLT 666* 604*   Lipid Panel: Recent Labs    08/29/22 0000  CHOL 141  HDL 67  LDLCALC 55  TRIG 108   TSH: No results for input(s): "TSH" in the last 8760 hours. A1C: No results found for: "HGBA1C"   Assessment/Plan 1. Essential hypertension -Blood pressure well controlled, goal bp <140/90 Continue current medications and dietary modifications follow metabolic panel - COMPLETE METABOLIC PANEL WITH GFR - CBC with Differential/Platelet  2. Osteopenia, unspecified location -Recommended to take calcium 600 mg twice daily with Vitamin D 2000 units daily and weight bearing activity 30 mins/5 days a week  3. Gastroesophageal reflux disease without esophagitis -stable on protonix   4. Hyperlipidemia, unspecified hyperlipidemia type Continues on lipitor with dietary modifications  - Lipid panel - COMPLETE METABOLIC PANEL WITH GFR  5. Essential thrombocytosis (HCC) Stable, continues to follow up with hematology - CBC with Differential/Platelet   Yearly follow up, labs ordered at appt  Persephanie Laatsch K. Biagio Borg [facility] [phone]

## 2023-01-23 NOTE — Patient Instructions (Addendum)
LAB APPT AT [facility] 7:30 on 9/23

## 2023-01-27 DIAGNOSIS — E785 Hyperlipidemia, unspecified: Secondary | ICD-10-CM | POA: Diagnosis not present

## 2023-01-27 DIAGNOSIS — D473 Essential (hemorrhagic) thrombocythemia: Secondary | ICD-10-CM | POA: Diagnosis not present

## 2023-01-27 DIAGNOSIS — I10 Essential (primary) hypertension: Secondary | ICD-10-CM | POA: Diagnosis not present

## 2023-01-27 LAB — LIPID PANEL
Cholesterol: 124 mg/dL (ref <200)
HDL: 53 mg/dL (ref >=50)
LDL Cholesterol (Calc): 50 mg/dL (calc)
Non-HDL Cholesterol (Calc): 71 mg/dL (calc) (ref <130)
Total CHOL/HDL Ratio: 2.3 (calc) (ref <5.0)
Triglycerides: 127 mg/dL (ref <150)

## 2023-01-27 LAB — COMPLETE METABOLIC PANEL WITH GFR
AG Ratio: 1.8 (calc) (ref 1.0–2.5)
ALT: 22 U/L (ref 6–29)
AST: 20 U/L (ref 10–35)
Albumin: 4.1 g/dL (ref 3.6–5.1)
Alkaline phosphatase (APISO): 77 U/L (ref 37–153)
BUN: 15 mg/dL (ref 7–25)
CO2: 31 mmol/L (ref 20–32)
Calcium: 9.4 mg/dL (ref 8.6–10.4)
Chloride: 104 mmol/L (ref 98–110)
Creat: 0.71 mg/dL (ref 0.60–0.95)
Globulin: 2.3 g/dL (calc) (ref 1.9–3.7)
Glucose, Bld: 79 mg/dL (ref 65–99)
Potassium: 4.7 mmol/L (ref 3.5–5.3)
Sodium: 142 mmol/L (ref 135–146)
Total Bilirubin: 0.5 mg/dL (ref 0.2–1.2)
Total Protein: 6.4 g/dL (ref 6.1–8.1)
eGFR: 85 mL/min/{1.73_m2} (ref >=60)

## 2023-01-27 LAB — CBC WITH DIFFERENTIAL/PLATELET
Absolute Monocytes: 554 cells/uL (ref 200–950)
Basophils Absolute: 92 cells/uL (ref 0–200)
Basophils Relative: 1.4 %
Eosinophils Absolute: 112 cells/uL (ref 15–500)
Eosinophils Relative: 1.7 %
HCT: 37.2 % (ref 35.0–45.0)
Hemoglobin: 13 g/dL (ref 11.7–15.5)
Lymphs Abs: 1135 cells/uL (ref 850–3900)
MCH: 35.1 pg — ABNORMAL HIGH (ref 27.0–33.0)
MCHC: 34.9 g/dL (ref 32.0–36.0)
MCV: 100.5 fL — ABNORMAL HIGH (ref 80.0–100.0)
MPV: 8.8 fL (ref 7.5–12.5)
Monocytes Relative: 8.4 %
Neutro Abs: 4706 cells/uL (ref 1500–7800)
Neutrophils Relative %: 71.3 %
Platelets: 719 10*3/uL — ABNORMAL HIGH (ref 140–400)
RBC: 3.7 10*6/uL — ABNORMAL LOW (ref 3.80–5.10)
RDW: 13.2 % (ref 11.0–15.0)
Total Lymphocyte: 17.2 %
WBC: 6.6 10*3/uL (ref 3.8–10.8)

## 2023-01-30 DIAGNOSIS — Z961 Presence of intraocular lens: Secondary | ICD-10-CM | POA: Diagnosis not present

## 2023-01-30 DIAGNOSIS — M3501 Sicca syndrome with keratoconjunctivitis: Secondary | ICD-10-CM | POA: Diagnosis not present

## 2023-01-30 DIAGNOSIS — Z0100 Encounter for examination of eyes and vision without abnormal findings: Secondary | ICD-10-CM | POA: Diagnosis not present

## 2023-01-30 DIAGNOSIS — H35341 Macular cyst, hole, or pseudohole, right eye: Secondary | ICD-10-CM | POA: Diagnosis not present

## 2023-01-30 DIAGNOSIS — H43813 Vitreous degeneration, bilateral: Secondary | ICD-10-CM | POA: Diagnosis not present

## 2023-02-12 ENCOUNTER — Other Ambulatory Visit: Payer: Self-pay | Admitting: Nurse Practitioner

## 2023-02-12 DIAGNOSIS — I679 Cerebrovascular disease, unspecified: Secondary | ICD-10-CM

## 2023-03-31 ENCOUNTER — Inpatient Hospital Stay: Payer: Medicare HMO | Attending: Hematology and Oncology | Admitting: Hematology and Oncology

## 2023-03-31 ENCOUNTER — Inpatient Hospital Stay: Payer: Medicare HMO

## 2023-03-31 VITALS — BP 138/52 | HR 82 | Temp 97.7°F | Resp 18 | Ht 67.0 in | Wt 157.1 lb

## 2023-03-31 DIAGNOSIS — D473 Essential (hemorrhagic) thrombocythemia: Secondary | ICD-10-CM

## 2023-03-31 DIAGNOSIS — D75839 Thrombocytosis, unspecified: Secondary | ICD-10-CM | POA: Diagnosis not present

## 2023-03-31 DIAGNOSIS — Z79899 Other long term (current) drug therapy: Secondary | ICD-10-CM | POA: Diagnosis not present

## 2023-03-31 LAB — CBC WITH DIFFERENTIAL (CANCER CENTER ONLY)
Abs Immature Granulocytes: 0.03 10*3/uL (ref 0.00–0.07)
Basophils Absolute: 0.1 10*3/uL (ref 0.0–0.1)
Basophils Relative: 1 %
Eosinophils Absolute: 0.2 10*3/uL (ref 0.0–0.5)
Eosinophils Relative: 3 %
HCT: 39 % (ref 36.0–46.0)
Hemoglobin: 13.2 g/dL (ref 12.0–15.0)
Immature Granulocytes: 0 %
Lymphocytes Relative: 16 %
Lymphs Abs: 1.4 10*3/uL (ref 0.7–4.0)
MCH: 33.3 pg (ref 26.0–34.0)
MCHC: 33.8 g/dL (ref 30.0–36.0)
MCV: 98.5 fL (ref 80.0–100.0)
Monocytes Absolute: 0.7 10*3/uL (ref 0.1–1.0)
Monocytes Relative: 9 %
Neutro Abs: 6.1 10*3/uL (ref 1.7–7.7)
Neutrophils Relative %: 71 %
Platelet Count: 698 10*3/uL — ABNORMAL HIGH (ref 150–400)
RBC: 3.96 MIL/uL (ref 3.87–5.11)
RDW: 14.2 % (ref 11.5–15.5)
WBC Count: 8.6 10*3/uL (ref 4.0–10.5)
nRBC: 0 % (ref 0.0–0.2)

## 2023-03-31 NOTE — Progress Notes (Signed)
Patient Care Team: [name], NP as PCP - General (Geriatric Medicine) Glendale Chard, DO as Consulting Physician (Neurology) Serena Croissant, MD as Consulting Physician (Hematology and Oncology)  DIAGNOSIS:  Encounter Diagnosis  Name Primary?   Essential thrombocytosis (HCC) Yes     CHIEF COMPLIANT: Follow-up of essential thrombocytosis on Hydrea  HISTORY OF PRESENT ILLNESS:   History of Present Illness   The patient presents for a routine follow-up visit for Essential thrombocytosis. She reports no issues with diarrhea, nausea, or other side effects from her current medication regimen, which she continues to take four days a week. She has about three weeks of Hydrea left and is not yet concerned about refilling her prescription. The patient is active, playing golf twice a week, although she expresses some fatigue related to the travel time to the golf course.         ALLERGIES:  is allergic to lisinopril, ace inhibitors, and other.  MEDICATIONS:  Current Outpatient Medications  Medication Sig Dispense Refill   amLODipine-valsartan (EXFORGE) 5-160 MG tablet TAKE 1 TABLET BY MOUTH EVERY DAY 90 tablet 1   aspirin EC 81 MG tablet Take 1 tablet (81 mg total) by mouth daily.     atorvastatin (LIPITOR) 20 MG tablet TAKE 1 TABLET BY MOUTH EVERY DAY 90 tablet 1   cholecalciferol (VITAMIN D) 1000 units tablet Take 1 tablet by mouth daily     clonazePAM (KLONOPIN) 0.5 MG tablet Take 1 tablet by mouth daily as needed for anxiety or sleep 30 tablet 5   clopidogrel (PLAVIX) 75 MG tablet TAKE 1 TABLET BY MOUTH ONCE DAILY TO PREVENT STROKE 90 tablet 1   hydroxyurea (HYDREA) 500 MG capsule Take 1 capsule (500 mg total) by mouth 4 (four) times a week. 90 capsule 1   loratadine (CLARITIN) 10 MG tablet Take 10 mg by mouth daily.     Multiple Vitamins-Minerals (MULTIVITAMIN ADULT) TABS Take 1 tablet by mouth daily     pantoprazole (PROTONIX) 40 MG tablet Take 1 tablet (40 mg total) by  mouth daily. 90 tablet 1   triamterene-hydrochlorothiazide (MAXZIDE-25) 37.5-25 MG tablet Take 0.5 tablets by mouth daily. 90 tablet 1   vitamin C (ASCORBIC ACID) 500 MG tablet Take 500 mg by mouth daily.     No current facility-administered medications for this visit.    PHYSICAL EXAMINATION: ECOG PERFORMANCE STATUS: 1 - Symptomatic but completely ambulatory  Vitals:   03/31/23 1019  BP: (!) 138/52  Pulse: 82  Resp: 18  Temp: 97.7 F (36.5 C)  SpO2: 96%   Filed Weights   03/31/23 1019  Weight: 157 lb 1.6 oz (71.3 kg)      LABORATORY DATA:  I have reviewed the data as listed    Latest Ref Rng & Units 01/27/2023    7:45 AM 08/29/2022   12:00 AM 08/21/2021   12:00 AM  CMP  Glucose 65 - 99 mg/dL 79     BUN 7 - 25 mg/dL 15  17  16    Creatinine 0.60 - 0.95 mg/dL 1.61  0.9  0.8   Sodium 135 - 146 mmol/L 142  139  141   Potassium 3.5 - 5.3 mmol/L 4.7  4.9  3.7   Chloride 98 - 110 mmol/L 104  101  104   CO2 20 - 32 mmol/L 31  32  31   Calcium 8.6 - 10.4 mg/dL 9.4  9.7  9.8   Total Protein 6.1 - 8.1 g/dL 6.4  Total Bilirubin 0.2 - 1.2 mg/dL 0.5     Alkaline Phos 25 - 125  86    AST 10 - 35 U/L 20  22  19    ALT 6 - 29 U/L 22  18  16      Lab Results  Component Value Date   WBC 6.6 01/27/2023   HGB 13.0 01/27/2023   HCT 37.2 01/27/2023   MCV 100.5 (H) 01/27/2023   PLT 719 (H) 01/27/2023   NEUTROABS 4,706 01/27/2023    ASSESSMENT & PLAN:  Essential thrombocytosis (HCC) 07/31/2016: hemoglobin 15.3, Platelet count 657 02/02/2020: Hemoglobin 13.1, platelets 927 03/15/20: Hb 12.2, Pl 609 03/19/2021: Hemoglobin 12.4, platelets 544 09/24/2021: Hemoglobin 13.2, platelets 570 09/26/2022: Hemoglobin 13.6, platelets 604   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   Treatment plan: 1. Aspirin 81 mg daily started April  2021 2. hydroxyurea 500 mg daily switched to 4 days a week on 08/14/2020   Hydrea toxicity: Hair thinning She golfs twice a week at [facility]   Responding to [place] and tolerating it well. I will call her with today's blood work. Return to clinic in 6 months with labs and follow-up       Orders Placed This Encounter  Procedures   CBC with Differential (Cancer Center Only)    Standing Status:   Standing    Number of Occurrences:   20    Standing Expiration Date:   03/30/2024   The patient has a good understanding of the overall plan. she agrees with it. she will call with any problems that may develop before the next visit here. Total time spent: 30 mins including face to face time and time spent for planning, charting and co-ordination of care   Tamsen Meek, MD 03/31/23

## 2023-03-31 NOTE — Assessment & Plan Note (Signed)
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   Treatment plan: 1. Aspirin 81 mg daily started April 2021 2. hydroxyurea 500 mg daily switched to 4 days a week on 08/14/2020   Hydrea toxicity: Hair thinning     Responding to hydrea and tolerating it well. Return to clinic in 1 year with labs and follow-up

## 2023-04-01 ENCOUNTER — Telehealth: Payer: Self-pay | Admitting: Hematology and Oncology

## 2023-04-01 NOTE — Telephone Encounter (Signed)
Left patient a message in regards to schedled appointment times/dates

## 2023-05-14 ENCOUNTER — Other Ambulatory Visit: Payer: Self-pay | Admitting: Family

## 2023-06-09 DIAGNOSIS — L814 Other melanin hyperpigmentation: Secondary | ICD-10-CM | POA: Diagnosis not present

## 2023-06-09 DIAGNOSIS — L821 Other seborrheic keratosis: Secondary | ICD-10-CM | POA: Diagnosis not present

## 2023-06-09 DIAGNOSIS — D1801 Hemangioma of skin and subcutaneous tissue: Secondary | ICD-10-CM | POA: Diagnosis not present

## 2023-06-09 DIAGNOSIS — L570 Actinic keratosis: Secondary | ICD-10-CM | POA: Diagnosis not present

## 2023-06-17 ENCOUNTER — Other Ambulatory Visit: Payer: Self-pay | Admitting: Nurse Practitioner

## 2023-06-18 DIAGNOSIS — Z1231 Encounter for screening mammogram for malignant neoplasm of breast: Secondary | ICD-10-CM | POA: Diagnosis not present

## 2023-06-18 LAB — HM MAMMOGRAPHY

## 2023-06-20 ENCOUNTER — Encounter: Payer: Self-pay | Admitting: Nurse Practitioner

## 2023-08-10 ENCOUNTER — Other Ambulatory Visit: Payer: Self-pay | Admitting: Nurse Practitioner

## 2023-08-10 DIAGNOSIS — I679 Cerebrovascular disease, unspecified: Secondary | ICD-10-CM

## 2023-08-13 ENCOUNTER — Other Ambulatory Visit: Payer: Self-pay | Admitting: Nurse Practitioner

## 2023-08-13 NOTE — Telephone Encounter (Signed)
 High risk warning populated when attempting to refill medication  Please review and approval if appropriate   Thanks,  [facility] W/CMA

## 2023-09-04 ENCOUNTER — Ambulatory Visit: Payer: Medicare HMO | Admitting: Nurse Practitioner

## 2023-09-04 ENCOUNTER — Encounter: Payer: Self-pay | Admitting: Nurse Practitioner

## 2023-09-04 VITALS — BP 118/68 | HR 73 | Temp 97.4°F | Ht 67.0 in | Wt 152.0 lb

## 2023-09-04 DIAGNOSIS — Z0000 Encounter for general adult medical examination without abnormal findings: Secondary | ICD-10-CM

## 2023-09-04 NOTE — Patient Instructions (Signed)
  Andrea Landry , Thank you for taking time to come for your Medicare Wellness Visit. I appreciate your ongoing commitment to your health goals. Please review the following plan we discussed and let me know if I can assist you in the future.     This is a list of the screening recommended for you and due dates:  Health Maintenance  Topic Date Due   COVID-19 Vaccine (7 - Pfizer risk 2024-25 season) 11/08/2023   Flu Shot  12/05/2023   Medicare Annual Wellness Visit  09/03/2024   DEXA scan (bone density measurement)  10/28/2024   DTaP/Tdap/Td vaccine (4 - Td or Tdap) 10/04/2032   Pneumonia Vaccine  Completed   Zoster (Shingles) Vaccine  Completed   HPV Vaccine  Aged Out   Meningitis B Vaccine  Aged Out

## 2023-09-04 NOTE — Progress Notes (Signed)
Subjective:   Andrea Landry is a 83 y.o. female who presents for Medicare Annual (Subsequent) preventive examination.  Visit Complete: In person [facility]  Cardiac Risk Factors include: advanced age (>81men, >5 women)     Objective:    Today's Vitals   09/04/23 1051  BP: 118/68  Pulse: 73  Temp: (!) 97.4 F (36.3 C)  SpO2: 95%  Weight: 152 lb (68.9 kg)  Height: 5\' 7"  (1.702 m)   Body mass index is 23.81 kg/m.     09/04/2023   10:53 AM 01/23/2023    9:00 AM 01/14/2023    9:05 AM 08/29/2022   11:00 AM 08/29/2022   10:59 AM 01/10/2022    9:33 AM 12/10/2021    1:31 PM  Advanced Directives  Does Patient Have a Medical Advance Directive? Yes Yes Yes Yes Yes Yes Yes  Type of Estate agent of [place];Living will Out of facility DNR (pink MOST or yellow form) Out of facility DNR (pink MOST or yellow form) Out of facility DNR (pink MOST or yellow form);Healthcare Power of [employer] of [place];Out of facility DNR (pink MOST or yellow form) Out of facility DNR (pink MOST or yellow form) Healthcare Power of [place];Living will;Out of facility DNR (pink MOST or yellow form)  Does patient want to make changes to medical advance directive? No - Patient declined No - Patient declined No - Patient declined No - Patient declined No - Patient declined No - Patient declined   Copy of Healthcare Power of Attorney in Chart? No - copy requested   No - copy requested No - copy requested    Pre-existing out of facility DNR order (yellow form or pink MOST form)      Pink MOST form placed in chart (order not valid for inpatient use)     Current Medications (verified) Outpatient Encounter Medications as of 09/04/2023  Medication Sig   amLODipine -valsartan  (EXFORGE ) 5-160 MG tablet TAKE 1 TABLET BY MOUTH EVERY DAY   aspirin  EC 81 MG tablet Take 1 tablet (81 mg total) by mouth daily.   atorvastatin  (LIPITOR) 20 MG tablet TAKE 1 TABLET BY MOUTH EVERY DAY    cholecalciferol (VITAMIN D ) 1000 units tablet Take 1 tablet by mouth daily   clonazePAM  (KLONOPIN ) 0.5 MG tablet Take 1 tablet by mouth daily as needed for anxiety or sleep   clopidogrel  (PLAVIX ) 75 MG tablet TAKE 1 TABLET BY MOUTH ONCE DAILY TO PREVENT STROKE   hydroxyurea  (HYDREA ) 500 MG capsule Take 1 capsule (500 mg total) by mouth 4 (four) times a week.   loratadine (CLARITIN) 10 MG tablet Take 10 mg by mouth daily.   Multiple Vitamins-Minerals (MULTIVITAMIN ADULT) TABS Take 1 tablet by mouth daily   pantoprazole  (PROTONIX ) 40 MG tablet TAKE 1 TABLET BY MOUTH EVERY DAY   triamterene -hydrochlorothiazide (MAXZIDE-25) 37.5-25 MG tablet TAKE 1/2 TABLET BY MOUTH DAILY   vitamin C (ASCORBIC ACID) 500 MG tablet Take 500 mg by mouth daily.   No facility-administered encounter medications on file as of 09/04/2023.    Allergies (verified) Lisinopril, Ace inhibitors, and Other   History: Past Medical History:  Diagnosis Date   Abnormal nuclear stress test    Cancer (HCC)    BREAST CANCER   DJD (degenerative joint disease)    Knees and elbows   DOE (dyspnea on exertion)    Fibrocystic breast disease    GERD (gastroesophageal reflux disease)    History of colon polyps  Removal of polyp July 2008   Hyperlipidemia    Hypertension    Osteopenia 03/2014   T score -1.4 FRAX 10%/1.8%. No change from prior DEXA   Rosacea    Sigmoid diverticulitis    SOB (shortness of breath)    TIA (transient ischemic attack)    Past Surgical History:  Procedure Laterality Date   BREAST ENHANCEMENT SURGERY     FOLLOWING MASTECTOMY FOR BREAST CANCER   BREAST LUMPECTOMY  1991   BREAST SURGERY     MULTIPLE BREAST BIOPSIES,    MASTECTOMY     RIGHT BREAST   right hammer toe and Bunion  05/07/2011   Family History  Problem Relation Age of Onset   Cancer Mother        RENAL   Other Father    Cerebral palsy Daughter    Healthy Son    Social History   Socioeconomic History   Marital status:  Married    Spouse name: Not on file   Number of children: 2   Years of education: college   Highest education level: Bachelor's degree (e.g., BA, AB, BS)  Occupational History   Occupation: retired  Tobacco Use   Smoking status: Former    Current packs/day: 0.00    Average packs/day: 1 pack/day for 40.0 years (40.0 ttl pk-yrs)    Types: Cigarettes    Start date: 71    Quit date: 1990    Years since quitting: 35.3   Smokeless tobacco: Never  Vaping Use   Vaping status: Never Used  Substance and Sexual Activity   Alcohol use: Yes    Alcohol/week: 10.0 standard drinks of alcohol    Types: 10 Standard drinks or equivalent per week    Comment: 1-2 glasses wine per night   Drug use: No   Sexual activity: Not on file  Other Topics Concern   Not on file  Social History Narrative   Lives with husband in a 2 story home.  Has 2 children.     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     Education: college.    Right handed.   Social Drivers of Corporate investment banker Strain: Low Risk  (01/20/2023)   Overall Financial Resource Strain (CARDIA)    Difficulty of Paying Living Expenses: Not hard at all  Food Insecurity: No Food Insecurity (09/04/2023)   Hunger Vital Sign    Worried About Running Out of Food in the Last Year: Never true    Ran Out of Food in the Last Year: Never true  Transportation Needs: No Transportation Needs (09/04/2023)   PRAPARE - Administrator, Civil Service (Medical): No    Lack of Transportation (Non-Medical): No  Physical Activity: Sufficiently Active (01/20/2023)   Exercise Vital Sign    Days of Exercise per Week: 6 days    Minutes of Exercise per Session: 120 min  Stress: Stress Concern Present (01/20/2023)   [employer] of Occupational Health - Occupational Stress Questionnaire    Feeling of Stress : To some extent  Social Connections: Socially Integrated (01/20/2023)   Social Connection and Isolation Panel [NHANES]    Frequency  of Communication with Friends and Family: More than three times a week    Frequency of Social Gatherings with Friends and Family: Three times a week    Attends Religious Services: More than 4 times per year    Active Member of Clubs or Organizations: Yes    Attends Banker  Meetings: More than 4 times per year    Marital Status: Married    Tobacco Counseling Counseling given: Not Answered   Clinical Intake:  Pre-visit preparation completed: Yes        BMI - recorded: 23 Nutritional Status: BMI of 19-24  Normal Diabetes: No  How often do you need to have someone help you when you read instructions, pamphlets, or other written materials from your doctor or pharmacy?: 1 - Never         Activities of Daily Living    09/04/2023   10:55 AM  In your present state of health, do you have any difficulty performing the following activities:  Hearing? 0  Vision? 1  Difficulty concentrating or making decisions? 0  Walking or climbing stairs? 0  Dressing or bathing? 0  Doing errands, shopping? 0  Preparing Food and eating ? N  Using the Toilet? N  In the past six months, have you accidently leaked urine? Y  Do you have problems with loss of bowel control? N  Managing your Medications? N  Managing your Finances? N  Housekeeping or managing your Housekeeping? N    Patient Care Team: Verma Gobble, NP as PCP - General (Geriatric Medicine) Patel, Donika K, DO as Consulting Physician (Neurology) Cameron Cea, MD as Consulting Physician (Hematology and Oncology)  Indicate any recent Medical Services you may have received from other than [facility] providers in the past year (date may be approximate).     Assessment:   This is a routine wellness examination for Andrea Landry.  Hearing/Vision screen Vision Screening - Comments:: [facility] Last Eye Exam: 01/2023   Goals Addressed             This Visit's Progress    Patient Stated   On track    Maintain and stay  active.        Depression Screen    09/04/2023   10:53 AM 08/29/2022   11:02 AM 11/08/2021   11:12 AM  PHQ 2/9 Scores  PHQ - 2 Score 0 0 0    Fall Risk    09/04/2023   10:52 AM 08/29/2022   11:02 AM 12/10/2021    1:31 PM 11/08/2021   11:12 AM 08/25/2020    3:07 PM  Fall Risk   Falls in the past year? 0 0 0 0 1  Number falls in past yr: 0 0 0 0 1  Injury with Fall? 0 0 0 0 0  Risk for fall due to : No Fall Risks   No Fall Risks   Follow up Falls evaluation completed   Falls evaluation completed     MEDICARE RISK AT HOME: Medicare Risk at Home Any stairs in or around the home?: Yes If so, are there any without handrails?: No Home free of loose throw rugs in walkways, pet beds, electrical cords, etc?: Yes Adequate lighting in your home to reduce risk of falls?: Yes Life alert?: No Use of a cane, walker or w/c?: No Grab bars in the bathroom?: Yes Shower chair or bench in shower?: Yes Elevated toilet seat or a handicapped toilet?: Yes  TIMED UP AND GO:  Was the test performed?  No    Cognitive Function:        09/04/2023   10:56 AM 08/29/2022   11:02 AM  6CIT Screen  What Year? 0 points 0 points  What month? 0 points 0 points  What time? 0 points 0 points  Count back from 20  0 points 0 points  Months in reverse 0 points 0 points  Repeat phrase 0 points 0 points  Total Score 0 points 0 points    Immunizations Immunization History  Administered Date(s) Administered   DTP 02/12/2006   Influenza Inj Mdck Quad Pf 02/22/2017, 01/26/2018   Influenza Split 02/20/2011, 02/04/2012, 02/26/2013, 01/21/2014, 02/10/2015, 03/01/2016   Influenza, Seasonal, Injecte, Preservative Fre 02/09/2023   Influenza-Unspecified 01/18/2022   PFIZER(Purple Top)SARS-COV-2 Vaccination 05/20/2019, 06/08/2019, 02/14/2020, 09/21/2020   Pfizer Covid-19 Vaccine Bivalent Booster 68yrs & up 01/25/2021   Pfizer(Comirnaty)Fall Seasonal Vaccine 12 years and older 05/11/2023   Pneumococcal Conjugate-13  05/02/2014   Pneumococcal Polysaccharide-23 03/18/2007, 08/04/2019   RSV,unspecified 02/12/2022   Tdap 06/07/2011, 10/05/2022   Zoster Recombinant(Shingrix) 04/18/2021, 07/04/2021   Zoster, Live 02/15/2007, 07/11/2021    TDAP status: Up to date  Flu Vaccine status: Up to date  Pneumococcal vaccine status: Up to date  Covid-19 vaccine status: Information provided on how to obtain vaccines.   Qualifies for Shingles Vaccine? Yes   Zostavax completed No   Shingrix Completed?: Yes  Screening Tests Health Maintenance  Topic Date Due   COVID-19 Vaccine (7 - Pfizer risk 2024-25 season) 11/08/2023   INFLUENZA VACCINE  12/05/2023   Medicare Annual Wellness (AWV)  09/03/2024   DEXA SCAN  10/28/2024   DTaP/Tdap/Td (4 - Td or Tdap) 10/04/2032   Pneumonia Vaccine 50+ Years old  Completed   Zoster Vaccines- Shingrix  Completed   HPV VACCINES  Aged Out   Meningococcal B Vaccine  Aged Out    Health Maintenance  There are no preventive care reminders to display for this patient.   Colorectal cancer screening: No longer required.   Mammogram status: No longer required due to age.  Bone Density status: Completed 10/09/2022. Results reflect: Bone density results: OSTEOPENIA. Repeat every 2 years.  Lung Cancer Screening: (Low Dose CT Chest recommended if Age 38-80 years, 20 pack-year currently smoking OR have quit w/in 15years.) does not qualify.   Lung Cancer Screening Referral: na  Additional Screening:  Hepatitis C Screening: does not qualify; Completed na  Vision Screening: Recommended annual ophthalmology exams for early detection of glaucoma and other disorders of the eye. Is the patient up to date with their annual eye exam?  Yes  Who is the provider or what is the name of the office in which the patient attends annual eye exams? [facility] If pt is not established with a provider, would they like to be referred to a provider to establish care? No .   Dental Screening:  Recommended annual dental exams for proper oral hygiene   Community Resource Referral / Chronic Care Management: CRR required this visit?  No   CCM required this visit?  No     Plan:     I have personally reviewed and noted the following in the patient's chart:   Medical and social history Use of alcohol, tobacco or illicit drugs  Current medications and supplements including opioid prescriptions. Patient is not currently taking opioid prescriptions. Functional ability and status Nutritional status Physical activity Advanced directives List of other physicians Hospitalizations, surgeries, and ER visits in previous 12 months Vitals Screenings to include cognitive, depression, and falls Referrals and appointments  In addition, I have reviewed and discussed with patient certain preventive protocols, quality metrics, and best practice recommendations. A written personalized care plan for preventive services as well as general preventive health recommendations were provided to patient.     Verma Gobble, NP  09/04/2023      

## 2023-09-30 ENCOUNTER — Inpatient Hospital Stay: Payer: Medicare HMO | Attending: Hematology and Oncology

## 2023-09-30 ENCOUNTER — Inpatient Hospital Stay: Payer: Medicare HMO | Admitting: Hematology and Oncology

## 2023-09-30 VITALS — BP 120/60 | HR 71 | Temp 97.4°F | Resp 18 | Ht 67.0 in | Wt 152.1 lb

## 2023-09-30 DIAGNOSIS — D473 Essential (hemorrhagic) thrombocythemia: Secondary | ICD-10-CM

## 2023-09-30 DIAGNOSIS — D75839 Thrombocytosis, unspecified: Secondary | ICD-10-CM | POA: Diagnosis not present

## 2023-09-30 LAB — CBC WITH DIFFERENTIAL (CANCER CENTER ONLY)
Abs Immature Granulocytes: 0.02 10*3/uL (ref 0.00–0.07)
Basophils Absolute: 0.1 10*3/uL (ref 0.0–0.1)
Basophils Relative: 2 %
Eosinophils Absolute: 0.2 10*3/uL (ref 0.0–0.5)
Eosinophils Relative: 2 %
HCT: 43.1 % (ref 36.0–46.0)
Hemoglobin: 14.3 g/dL (ref 12.0–15.0)
Immature Granulocytes: 0 %
Lymphocytes Relative: 17 %
Lymphs Abs: 1.3 10*3/uL (ref 0.7–4.0)
MCH: 31.2 pg (ref 26.0–34.0)
MCHC: 33.2 g/dL (ref 30.0–36.0)
MCV: 93.9 fL (ref 80.0–100.0)
Monocytes Absolute: 0.7 10*3/uL (ref 0.1–1.0)
Monocytes Relative: 8 %
Neutro Abs: 5.7 10*3/uL (ref 1.7–7.7)
Neutrophils Relative %: 71 %
Platelet Count: 655 10*3/uL — ABNORMAL HIGH (ref 150–400)
RBC: 4.59 MIL/uL (ref 3.87–5.11)
RDW: 13.5 % (ref 11.5–15.5)
WBC Count: 7.9 10*3/uL (ref 4.0–10.5)
nRBC: 0 % (ref 0.0–0.2)

## 2023-09-30 NOTE — Assessment & Plan Note (Signed)
 07/31/2016: hemoglobin 15.3, Platelet count 657 02/02/2020: Hemoglobin 13.1, platelets 927 03/15/20: Hb 12.2, Pl 609 03/19/2021: Hemoglobin 12.4, platelets 544 09/24/2021: Hemoglobin 13.2, platelets 570 09/26/2022: Hemoglobin 13.6, platelets 604 09/30/2023:   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   Treatment plan: 1. Aspirin  81 mg daily started April 2021 2. hydroxyurea  500 mg daily switched to 4 days a week on 08/14/2020   Hydrea  toxicity: Hair thinning She golfs twice a week at [facility]   Responding to hydrea  and tolerating it well. I will call her with today's blood work. Return to clinic in 6 months with labs and follow-up

## 2023-09-30 NOTE — Progress Notes (Signed)
 Patient Care Team: Verma Gobble, NP as PCP - General (Geriatric Medicine) Patel, Donika K, DO as Consulting Physician (Neurology) Cameron Cea, MD as Consulting Physician (Hematology and Oncology)  DIAGNOSIS:  Encounter Diagnosis  Name Primary?   Essential thrombocytosis (HCC) Yes    CHIEF COMPLIANT: Follow-up of essential thrombocytosis on Hydrea   HISTORY OF PRESENT ILLNESS:  History of Present Illness Andrea Landry "Bobbye Burrow" is an 83 year old female with essential thrombocythemia who presents for routine follow-up on hydroxyurea .  She is on hydroxyurea  four times a week, specifically every other day plus Sunday, for essential thrombocythemia. She experiences no adverse effects such as nausea, diarrhea, or mouth sores. Her platelet count has decreased to 655, showing improvement from previous counts of 698 and 719.  She maintains an active lifestyle, playing golf twice a week and participating in a Sunday scramble, walking the course each time.     ALLERGIES:  is allergic to lisinopril, ace inhibitors, and other.  MEDICATIONS:  Current Outpatient Medications  Medication Sig Dispense Refill   amLODipine -valsartan  (EXFORGE ) 5-160 MG tablet TAKE 1 TABLET BY MOUTH EVERY DAY 90 tablet 1   aspirin  EC 81 MG tablet Take 1 tablet (81 mg total) by mouth daily.     atorvastatin  (LIPITOR) 20 MG tablet TAKE 1 TABLET BY MOUTH EVERY DAY 90 tablet 1   cholecalciferol (VITAMIN D ) 1000 units tablet Take 1 tablet by mouth daily     clopidogrel  (PLAVIX ) 75 MG tablet TAKE 1 TABLET BY MOUTH ONCE DAILY TO PREVENT STROKE 90 tablet 1   hydroxyurea  (HYDREA ) 500 MG capsule Take 1 capsule (500 mg total) by mouth 4 (four) times a week. 90 capsule 1   loratadine (CLARITIN) 10 MG tablet Take 10 mg by mouth daily.     Multiple Vitamins-Minerals (MULTIVITAMIN ADULT) TABS Take 1 tablet by mouth daily     pantoprazole  (PROTONIX ) 40 MG tablet TAKE 1 TABLET BY MOUTH EVERY DAY 90 tablet 3    triamterene -hydrochlorothiazide (MAXZIDE-25) 37.5-25 MG tablet TAKE 1/2 TABLET BY MOUTH DAILY 45 tablet 3   vitamin C (ASCORBIC ACID) 500 MG tablet Take 500 mg by mouth daily.     clonazePAM  (KLONOPIN ) 0.5 MG tablet Take 1 tablet by mouth daily as needed for anxiety or sleep (Patient not taking: Reported on 09/30/2023) 30 tablet 5   No current facility-administered medications for this visit.    PHYSICAL EXAMINATION: ECOG PERFORMANCE STATUS: 1 - Symptomatic but completely ambulatory  Vitals:   09/30/23 1133  BP: 120/60  Pulse: 71  Resp: 18  Temp: (!) 97.4 F (36.3 C)  SpO2: 97%   Filed Weights   09/30/23 1133  Weight: 152 lb 1.6 oz (69 kg)      LABORATORY DATA:  I have reviewed the data as listed    Latest Ref Rng & Units 01/27/2023    7:45 AM 08/29/2022   12:00 AM 08/21/2021   12:00 AM  CMP  Glucose 65 - 99 mg/dL 79     BUN 7 - 25 mg/dL 15  17  16    Creatinine 0.60 - 0.95 mg/dL 9.56  0.9  0.8   Sodium 135 - 146 mmol/L 142  139  141   Potassium 3.5 - 5.3 mmol/L 4.7  4.9  3.7   Chloride 98 - 110 mmol/L 104  101  104   CO2 20 - 32 mmol/L 31  32  31   Calcium  8.6 - 10.4 mg/dL 9.4  9.7  9.8   Total  Protein 6.1 - 8.1 g/dL 6.4     Total Bilirubin 0.2 - 1.2 mg/dL 0.5     Alkaline Phos 25 - 125  86    AST 10 - 35 U/L 20  22  19    ALT 6 - 29 U/L 22  18  16      Lab Results  Component Value Date   WBC 7.9 09/30/2023   HGB 14.3 09/30/2023   HCT 43.1 09/30/2023   MCV 93.9 09/30/2023   PLT 655 (H) 09/30/2023   NEUTROABS 5.7 09/30/2023    ASSESSMENT & PLAN:  Essential thrombocytosis (HCC) 07/31/2016: hemoglobin 15.3, Platelet count 657 02/02/2020: Hemoglobin 13.1, platelets 927 03/15/20: Hb 12.2, Pl 609 03/19/2021: Hemoglobin 12.4, platelets 544 09/24/2021: Hemoglobin 13.2, platelets 570 09/26/2022: Hemoglobin 13.6, platelets 604 09/30/2023:   JAK2 V 617 mutation: Positive Essential thrombocythemia: (low risk: Platelets less than thousand and no history of blood  clots) The other rare adverse effect is transformation to myelofibrosis.   Treatment plan: 1. Aspirin  81 mg daily started April 2021 2. hydroxyurea  500 mg daily switched to 4 days a week on 08/14/2020   Hydrea  toxicity: Hair thinning She golfs twice a week at [facility] Her husband unfortunately is not doing well and is at home with hospice (COPD)   Responding to hydrea  and tolerating it well. Return to clinic in 6 months with labs and follow-up     No orders of the defined types were placed in this encounter.  The patient has a good understanding of the overall plan. she agrees with it. she will call with any problems that may develop before the next visit here. Total time spent: 30 mins including face to face time and time spent for planning, charting and co-ordination of care   Viinay K Jamariah Tony, MD 09/30/23

## 2023-12-09 ENCOUNTER — Other Ambulatory Visit: Payer: Self-pay | Admitting: Nurse Practitioner

## 2023-12-09 ENCOUNTER — Telehealth: Payer: Self-pay

## 2023-12-09 DIAGNOSIS — G4700 Insomnia, unspecified: Secondary | ICD-10-CM

## 2023-12-09 DIAGNOSIS — F419 Anxiety disorder, unspecified: Secondary | ICD-10-CM

## 2023-12-09 MED ORDER — CLONAZEPAM 0.5 MG PO TABS: ORAL_TABLET | ORAL | 1 refills | Status: AC

## 2023-12-09 NOTE — Telephone Encounter (Signed)
 Patient has request for refill on Clonazepam . Patient last refill was 08/29/2022. Patient has no contract on file. Patient has upcoming appointment 01/22/2024. Updated contract/Sign contract was added to appointment notes. Medication pend and sent to PCP Arch, Jessica K, NP) for approval.

## 2023-12-09 NOTE — Telephone Encounter (Signed)
 Pharmacy needed rx code for Clonazepam .

## 2023-12-16 ENCOUNTER — Other Ambulatory Visit: Payer: Self-pay | Admitting: Hematology and Oncology

## 2024-01-22 ENCOUNTER — Encounter: Payer: Self-pay | Admitting: Nurse Practitioner

## 2024-01-22 ENCOUNTER — Ambulatory Visit: Payer: Medicare HMO | Admitting: Nurse Practitioner

## 2024-01-22 VITALS — BP 116/64 | HR 77 | Temp 98.2°F | Ht 67.0 in | Wt 149.2 lb

## 2024-01-22 DIAGNOSIS — F4321 Adjustment disorder with depressed mood: Secondary | ICD-10-CM | POA: Diagnosis not present

## 2024-01-22 DIAGNOSIS — D473 Essential (hemorrhagic) thrombocythemia: Secondary | ICD-10-CM | POA: Diagnosis not present

## 2024-01-22 DIAGNOSIS — I679 Cerebrovascular disease, unspecified: Secondary | ICD-10-CM | POA: Diagnosis not present

## 2024-01-22 DIAGNOSIS — M8580 Other specified disorders of bone density and structure, unspecified site: Secondary | ICD-10-CM

## 2024-01-22 DIAGNOSIS — G4700 Insomnia, unspecified: Secondary | ICD-10-CM | POA: Diagnosis not present

## 2024-01-22 DIAGNOSIS — F419 Anxiety disorder, unspecified: Secondary | ICD-10-CM | POA: Diagnosis not present

## 2024-01-22 DIAGNOSIS — E785 Hyperlipidemia, unspecified: Secondary | ICD-10-CM | POA: Diagnosis not present

## 2024-01-22 DIAGNOSIS — K219 Gastro-esophageal reflux disease without esophagitis: Secondary | ICD-10-CM

## 2024-01-22 DIAGNOSIS — I10 Essential (primary) hypertension: Secondary | ICD-10-CM

## 2024-01-22 MED ORDER — TRAZODONE HCL 50 MG PO TABS: 25.0000 mg | ORAL_TABLET | Freq: Every day | ORAL | 1 refills | Status: AC

## 2024-01-22 NOTE — Progress Notes (Signed)
 Careteam: Patient Care Team: [name], NP as PCP - General (Geriatric Medicine) Patel, Donika K, DO as Consulting Physician (Neurology) Odean Potts, MD as Consulting Physician (Hematology and Oncology) PLACE OF SERVICE:  [facility]   Advanced Directive information Does Patient Have a Medical Advance Directive?: Yes, Type of Advance Directive: Healthcare Power of [place];Living will;Out of facility DNR (pink MOST or yellow form), Does patient want to make changes to medical advance directive?: No - Patient declined  Allergies  Allergen Reactions   Lisinopril Swelling   Ace Inhibitors     Other reaction(s): mild angioedema   Other Other (See Comments)    Chief Complaint  Patient presents with   Medical Management of Chronic Issues    Medical Management of Chronic Issues. Yearly Follow up. Contract Signed. To discuss need for Flu and Covid.      HPI: Patient is a 83 y.o. female seen in today for follow up Discussed the use of AI scribe software for clinical note transcription with the patient, who gave verbal consent to proceed.  History of Present Illness [place] Landgrebe is an 83 year old female with hypertension and cerebral vascular disease who presents for a routine follow-up.  She is due for blood work and plans to come in on Thursday for this. She is currently taking amlodipine , valsartan , and triamterene  hydrochlorothiazide, taken as half a tablet daily for blood pressure.  She takes pantoprazole  for indigestion.   She previously tried Claritin for allergies, which was ineffective, and switched to Zyrtec , which also did not work. Currently, she is not experiencing allergy symptoms.  She is followed by hematology for her elevated platelets  and is on aspirin  81 mg daily and hydroxyurea  500 mg four times a week.   She has a history of transient ischemic attacks and is on Plavix  for cerebral vascular disease.  She is experiencing sleep  difficulties since the passing of her husband in 2023-11-26. She has been taking clonazepam  nightly since then to aid sleep.  Her husband was on hospice care and passed away in 2023-11-26. She describes the challenges faced with hospice care and the emotional impact of her husband's passing.  She is active, participating in water aerobics and Tai Chi, but reports shoulder pain during exercise. The pain occurs when she raises her arm fully, but she continues to maintain her activity level.  She is involved in social activities and is planning to attend a play.     Review of Systems:  Review of Systems  Constitutional:  Negative for chills, fever and weight loss.  HENT:  Negative for tinnitus.   Respiratory:  Negative for cough, sputum production and shortness of breath.   Cardiovascular:  Negative for chest pain, palpitations and leg swelling.  Gastrointestinal:  Negative for abdominal pain, constipation, diarrhea and heartburn.  Genitourinary:  Negative for dysuria, frequency and urgency.  Musculoskeletal:  Negative for back pain, falls, joint pain and myalgias.  Skin: Negative.   Neurological:  Negative for dizziness and headaches.  Psychiatric/Behavioral:  Negative for depression and memory loss. The patient has insomnia.     Past Medical History:  Diagnosis Date   Abnormal nuclear stress test    Cancer (HCC)    BREAST CANCER   DJD (degenerative joint disease)    Knees and elbows   DOE (dyspnea on exertion)    Fibrocystic breast disease    GERD (gastroesophageal reflux disease)    History of colon polyps  Removal of polyp July 2008   Hyperlipidemia    Hypertension    Osteopenia 03/2014   T score -1.4 FRAX 10%/1.8%. No change from prior DEXA   Rosacea    Sigmoid diverticulitis    SOB (shortness of breath)    TIA (transient ischemic attack)    Past Surgical History:  Procedure Laterality Date   BREAST ENHANCEMENT SURGERY     FOLLOWING MASTECTOMY FOR BREAST CANCER   BREAST  LUMPECTOMY  1991   BREAST SURGERY     MULTIPLE BREAST BIOPSIES,    MASTECTOMY     RIGHT BREAST   right hammer toe and Bunion  05/07/2011   Social History:   reports that she quit smoking about 35 years ago. Her smoking use included cigarettes. She started smoking about 75 years ago. She has a 40 pack-year smoking history. She has never used smokeless tobacco. She reports current alcohol use of about 10.0 standard drinks of alcohol per week. She reports that she does not use drugs.  Family History  Problem Relation Age of Onset   Cancer Mother        RENAL   Other Father    Cerebral palsy Daughter    Healthy Son     Medications: Patient's Medications  New Prescriptions   No medications on file  Previous Medications   AMLODIPINE -VALSARTAN  (EXFORGE ) 5-160 MG TABLET    TAKE 1 TABLET BY MOUTH EVERY DAY   ASPIRIN  EC 81 MG TABLET    Take 1 tablet (81 mg total) by mouth daily.   ATORVASTATIN  (LIPITOR) 20 MG TABLET    TAKE 1 TABLET BY MOUTH EVERY DAY   CHOLECALCIFEROL (VITAMIN D ) 1000 UNITS TABLET    Take 1 tablet by mouth daily   CLONAZEPAM  (KLONOPIN ) 0.5 MG TABLET    TAKE 1 TABLET BY MOUTH EVERY DAY AS NEEDED FOR ANXIETY OR SLEEP F41.9 AND G47.00   CLOPIDOGREL  (PLAVIX ) 75 MG TABLET    TAKE 1 TABLET BY MOUTH ONCE DAILY TO PREVENT STROKE   HYDROXYUREA  (HYDREA ) 500 MG CAPSULE    TAKE 1 CAPSULE (500 MG TOTAL) BY MOUTH 4 (FOUR) TIMES A WEEK.   LORATADINE (CLARITIN) 10 MG TABLET    Take 10 mg by mouth daily.   MULTIPLE VITAMINS-MINERALS (MULTIVITAMIN ADULT) TABS    Take 1 tablet by mouth daily   PANTOPRAZOLE  (PROTONIX ) 40 MG TABLET    TAKE 1 TABLET BY MOUTH EVERY DAY   TRIAMTERENE -HYDROCHLOROTHIAZIDE (MAXZIDE-25) 37.5-25 MG TABLET    TAKE 1/2 TABLET BY MOUTH DAILY   VITAMIN C (ASCORBIC ACID) 500 MG TABLET    Take 500 mg by mouth daily.  Modified Medications   No medications on file  Discontinued Medications   No medications on file    Physical Exam:  Vitals:   01/22/24 1054  BP:  116/64  Pulse: 77  Temp: 98.2 F (36.8 C)  SpO2: 95%  Weight: 149 lb 3.2 oz (67.7 kg)  Height: 5' 7 (1.702 m)   Body mass index is 23.37 kg/m. Wt Readings from Last 3 Encounters:  01/22/24 149 lb 3.2 oz (67.7 kg)  09/30/23 152 lb 1.6 oz (69 kg)  09/04/23 152 lb (68.9 kg)    Physical Exam Constitutional:      General: She is not in acute distress.    Appearance: She is well-developed. She is not diaphoretic.  HENT:     Head: Normocephalic and atraumatic.     Mouth/Throat:     Pharynx: No oropharyngeal exudate.  Eyes:  Conjunctiva/sclera: Conjunctivae normal.     Pupils: Pupils are equal, round, and reactive to light.  Cardiovascular:     Rate and Rhythm: Normal rate and regular rhythm.     Heart sounds: Normal heart sounds.  Pulmonary:     Effort: Pulmonary effort is normal.     Breath sounds: Normal breath sounds.  Abdominal:     General: Bowel sounds are normal.     Palpations: Abdomen is soft.  Musculoskeletal:     Right shoulder: Tenderness present.     Cervical back: Normal range of motion and neck supple.     Right lower leg: No edema.     Left lower leg: No edema.  Skin:    General: Skin is warm and dry.  Neurological:     Mental Status: She is alert.  Psychiatric:        Mood and Affect: Mood normal.    Labs reviewed: Basic Metabolic Panel: Recent Labs    01/27/23 0745  NA 142  K 4.7  CL 104  CO2 31  GLUCOSE 79  BUN 15  CREATININE 0.71  CALCIUM  9.4   Liver Function Tests: Recent Labs    01/27/23 0745  AST 20  ALT 22  BILITOT 0.5  PROT 6.4   No results for input(s): LIPASE, AMYLASE in the last 8760 hours. No results for input(s): AMMONIA in the last 8760 hours. CBC: Recent Labs    01/27/23 0745 03/31/23 1108 09/30/23 1116  WBC 6.6 8.6 7.9  NEUTROABS 4,706 6.1 5.7  HGB 13.0 13.2 14.3  HCT 37.2 39.0 43.1  MCV 100.5* 98.5 93.9  PLT 719* 698* 655*   Lipid Panel: Recent Labs    01/27/23 0745  CHOL 124  HDL 53   LDLCALC 50  TRIG 872  CHOLHDL 2.3   TSH: No results for input(s): TSH in the last 8760 hours. A1C: No results found for: HGBA1C   Assessment/Plan Assessment and Plan Assessment & Plan Thrombocytosis Managed with hydroxyurea  and aspirin . - Continue hydroxyurea  500 mg four times a week and aspirin  81 mg daily. - Follow-up with hematologist every six months.  Cerebrovascular disease with TIA Managed with Plavix . No recurrent issues - Continue Plavix .  Chronic insomnia with benzodiazepine dependence Transitioning from clonazepam  to trazodone  due to dependency concerns. - Prescribe trazodone  25 mg nightly for one week, then increase to 50 mg nightly. - Reduce clonazepam  to half a tablet nightly, then discontinue once trazodone  is at 50 mg. - Provide 90-day supply of trazodone  with a refill.  Right shoulder musculoskeletal pain Noted during exam, Pain likely due to exercise, full range of motion maintained. - Advise icing the shoulder three times a day for at least a week. - Avoid exercises that exacerbate pain. - If pain persists or range of motion is limited, consider referral to physical therapy.  Osteopenia No current calcium  supplementation, bone density screening due next year. - Recommend calcium  600 mg twice daily. - Encourage intake of calcium -rich foods. - Continue exercises to maintain bone health. -continue vit supplement    Essential hypertension -Blood pressure well controlled, goal bp <140/90 Continue current medications and dietary modifications follow metabolic panel - CBC with Differential/Platelet - Comprehensive metabolic panel with GFR   Hyperlipidemia, unspecified hyperlipidemia type (Primary) Continue on lipitor with dietary modification  - Lipid panel  Gastroesophageal reflux disease without esophagitis controlled on protonix  daily   Grief After passing of her husband Plans to attend counseling through hospice.  Effecting her sleep.   -  traZODone  (DESYREL ) 50 MG tablet; Take 0.5-1 tablets (25-50 mg total) by mouth at bedtime.  Dispense: 90 tablet; Refill: 1   Yearly, sooner if needed Jahmal Dunavant K. Caro BODILY  [facility] & [facility] [phone]

## 2024-01-29 DIAGNOSIS — I10 Essential (primary) hypertension: Secondary | ICD-10-CM | POA: Diagnosis not present

## 2024-01-29 DIAGNOSIS — E785 Hyperlipidemia, unspecified: Secondary | ICD-10-CM | POA: Diagnosis not present

## 2024-01-30 ENCOUNTER — Ambulatory Visit: Payer: Self-pay | Admitting: Nurse Practitioner

## 2024-01-30 LAB — COMPREHENSIVE METABOLIC PANEL WITH GFR
AG Ratio: 2 (calc) (ref 1.0–2.5)
ALT: 15 U/L (ref 6–29)
AST: 18 U/L (ref 10–35)
Albumin: 4.5 g/dL (ref 3.6–5.1)
Alkaline phosphatase (APISO): 85 U/L (ref 37–153)
BUN: 14 mg/dL (ref 7–25)
CO2: 31 mmol/L (ref 20–32)
Calcium: 9.8 mg/dL (ref 8.6–10.4)
Chloride: 101 mmol/L (ref 98–110)
Creat: 0.81 mg/dL (ref 0.60–0.95)
Globulin: 2.2 g/dL (ref 1.9–3.7)
Glucose, Bld: 97 mg/dL (ref 65–99)
Potassium: 4.4 mmol/L (ref 3.5–5.3)
Sodium: 139 mmol/L (ref 135–146)
Total Bilirubin: 0.7 mg/dL (ref 0.2–1.2)
Total Protein: 6.7 g/dL (ref 6.1–8.1)
eGFR: 72 mL/min/1.73m2 (ref >=60)

## 2024-01-30 LAB — CBC WITH DIFFERENTIAL/PLATELET
Absolute Lymphocytes: 1359 {cells}/uL (ref 850–3900)
Absolute Monocytes: 600 {cells}/uL (ref 200–950)
Basophils Absolute: 134 {cells}/uL (ref 0–200)
Basophils Relative: 1.7 %
Eosinophils Absolute: 174 {cells}/uL (ref 15–500)
Eosinophils Relative: 2.2 %
HCT: 43 % (ref 35.0–45.0)
Hemoglobin: 14.1 g/dL (ref 11.7–15.5)
MCH: 30.1 pg (ref 27.0–33.0)
MCHC: 32.8 g/dL (ref 32.0–36.0)
MCV: 91.9 fL (ref 80.0–100.0)
MPV: 8.5 fL (ref 7.5–12.5)
Monocytes Relative: 7.6 %
Neutro Abs: 5633 {cells}/uL (ref 1500–7800)
Neutrophils Relative %: 71.3 %
Platelets: 740 Thousand/uL — ABNORMAL HIGH (ref 140–400)
RBC: 4.68 Million/uL (ref 3.80–5.10)
RDW: 14.7 % (ref 11.0–15.0)
Total Lymphocyte: 17.2 %
WBC: 7.9 Thousand/uL (ref 3.8–10.8)

## 2024-01-30 LAB — LIPID PANEL
Cholesterol: 109 mg/dL (ref <200)
HDL: 64 mg/dL (ref >=50)
LDL Cholesterol (Calc): 26 mg/dL
Non-HDL Cholesterol (Calc): 45 mg/dL (ref <130)
Total CHOL/HDL Ratio: 1.7 (calc) (ref <5.0)
Triglycerides: 106 mg/dL (ref <150)

## 2024-02-02 DIAGNOSIS — H35341 Macular cyst, hole, or pseudohole, right eye: Secondary | ICD-10-CM | POA: Diagnosis not present

## 2024-02-02 DIAGNOSIS — Z961 Presence of intraocular lens: Secondary | ICD-10-CM | POA: Diagnosis not present

## 2024-02-02 DIAGNOSIS — H43813 Vitreous degeneration, bilateral: Secondary | ICD-10-CM | POA: Diagnosis not present

## 2024-02-02 DIAGNOSIS — M3501 Sicca syndrome with keratoconjunctivitis: Secondary | ICD-10-CM | POA: Diagnosis not present

## 2024-02-06 ENCOUNTER — Other Ambulatory Visit: Payer: Self-pay | Admitting: Nurse Practitioner

## 2024-02-06 DIAGNOSIS — I679 Cerebrovascular disease, unspecified: Secondary | ICD-10-CM

## 2024-02-06 NOTE — Telephone Encounter (Signed)
 Patient has request refill on medication that has High risk warnings. Medication pend and sent to Tawni America, NP who is covering for PCP Caro Jessica K, NP

## 2024-02-09 ENCOUNTER — Ambulatory Visit: Payer: Self-pay | Admitting: Nurse Practitioner

## 2024-02-09 ENCOUNTER — Ambulatory Visit: Admitting: Orthopedic Surgery

## 2024-02-09 ENCOUNTER — Encounter: Payer: Self-pay | Admitting: Orthopedic Surgery

## 2024-02-09 VITALS — BP 122/62 | HR 74 | Temp 97.2°F | Resp 16 | Ht 67.0 in | Wt 151.0 lb

## 2024-02-09 DIAGNOSIS — R052 Subacute cough: Secondary | ICD-10-CM | POA: Diagnosis not present

## 2024-02-09 DIAGNOSIS — R0602 Shortness of breath: Secondary | ICD-10-CM | POA: Diagnosis not present

## 2024-02-09 LAB — POCT INFLUENZA A/B
Influenza A, POC: NEGATIVE
Influenza B, POC: NEGATIVE

## 2024-02-09 LAB — POC COVID19 BINAXNOW: SARS Coronavirus 2 Ag: NEGATIVE

## 2024-02-09 MED ORDER — FLUTICASONE-SALMETEROL 100-50 MCG/ACT IN AEPB: 1.0000 | INHALATION_SPRAY | Freq: Two times a day (BID) | RESPIRATORY_TRACT | 3 refills | Status: AC

## 2024-02-09 NOTE — Progress Notes (Signed)
 Careteam: Patient Care Team: [name], NP as PCP - General (Geriatric Medicine) Patel, Donika K, DO as Consulting Physician (Neurology) Odean Potts, MD as Consulting Physician (Hematology and Oncology)  Seen by: Greig Cluster, AGNP-C  PLACE OF SERVICE:  [facility]  Advanced Directive information Does Patient Have a Medical Advance Directive?: Yes, Type of Advance Directive: Healthcare Power of [place];Living will;Out of facility DNR (pink MOST or yellow form), Does patient want to make changes to medical advance directive?: No - Patient declined  Allergies  Allergen Reactions   Lisinopril Swelling   Ace Inhibitors     Other reaction(s): mild angioedema   Other Other (See Comments)    Chief Complaint  Patient presents with   Cough    Patient complains of cough, and shortness of breath.     HPI: Patient is a 83 y.o. female seen today for acute visit due to shortness of breath and dry cough.   Discussed the use of AI scribe software for clinical note transcription with the patient, who gave verbal consent to proceed.  History of Present Illness   Andrea Landry is an 83 year old female with h/o COPD who presents with a persistent dry cough and shortness of breath.  She has been experiencing a persistent dry cough and shortness of breath for approximately three weeks. These symptoms began after a regular appointment with her previous provider. The cough is non-productive, and there is no fever or wheezing. Shortness of breath occurs during activities such as getting into a car or walking on the golf [place], especially on hills. She also feels unbalanced at times and has a sensation of fullness in her ears. She has not taken any antihistamines for these symptoms.  Her past medical history includes mild COPD due to history of smoking, which she quit 25 years ago. She has seen pulmonary specialists in the past and had PFT done. She has inhalers at home but does not  use them regularly.  She has thrombocytosis, which is monitored by Dr. Gudina, and she takes aspirin  daily along with hydroxyurea  to manage this condition. She has not experienced pain on deep inspiration, swelling in her ankles, or recent weight gain, although she has lost weight since her husband's passing in July.  Her husband recently passed away from COPD. Socially, she is coping with the loss of her husband and attends a grief support group. She lives near [place] and plays golf regularly, despite experiencing shortness of breath during the activity.  No fever, productive cough, wheezing, or swelling of the ankles. Occasional body aches and fullness in ears.       Review of Systems:  Review of Systems  Constitutional:  Negative for fever.  HENT:  Negative for sinus pain and sore throat.   Respiratory:  Positive for cough and shortness of breath. Negative for sputum production and wheezing.   Cardiovascular: Negative.   Gastrointestinal: Negative.   Genitourinary: Negative.   Musculoskeletal: Negative.   Neurological:  Positive for dizziness.  Psychiatric/Behavioral: Negative.      Past Medical History:  Diagnosis Date   Abnormal nuclear stress test    Cancer [facility])    BREAST CANCER   DJD (degenerative joint disease)    Knees and elbows   DOE (dyspnea on exertion)    Fibrocystic breast disease    GERD (gastroesophageal reflux disease)    History of colon polyps    Removal of polyp July 2008   Hyperlipidemia  Hypertension    Osteopenia 03/2014   T score -1.4 FRAX 10%/1.8%. No change from prior DEXA   Rosacea    Sigmoid diverticulitis    SOB (shortness of breath)    TIA (transient ischemic attack)    Past Surgical History:  Procedure Laterality Date   BREAST ENHANCEMENT SURGERY     FOLLOWING MASTECTOMY FOR BREAST CANCER   BREAST LUMPECTOMY  1991   BREAST SURGERY     MULTIPLE BREAST BIOPSIES,    MASTECTOMY     RIGHT BREAST   right hammer toe and Bunion   05/07/2011   Social History:   reports that she quit smoking about 35 years ago. Her smoking use included cigarettes. She started smoking about 75 years ago. She has a 40 pack-year smoking history. She has never used smokeless tobacco. She reports current alcohol use of about 10.0 standard drinks of alcohol per week. She reports that she does not use drugs.  Family History  Problem Relation Age of Onset   Cancer Mother        RENAL   Other Father    Cerebral palsy Daughter    Healthy Son     Medications: Patient's Medications  New Prescriptions   No medications on file  Previous Medications   AMLODIPINE -VALSARTAN  (EXFORGE ) 5-160 MG TABLET    TAKE 1 TABLET BY MOUTH EVERY DAY   ASPIRIN  EC 81 MG TABLET    Take 1 tablet (81 mg total) by mouth daily.   ATORVASTATIN  (LIPITOR) 20 MG TABLET    TAKE 1 TABLET BY MOUTH EVERY DAY   CHOLECALCIFEROL (VITAMIN D ) 1000 UNITS TABLET    Take 1 tablet by mouth daily   CLONAZEPAM  (KLONOPIN ) 0.5 MG TABLET    TAKE 1 TABLET BY MOUTH EVERY DAY AS NEEDED FOR ANXIETY OR SLEEP F41.9 AND G47.00   CLOPIDOGREL  (PLAVIX ) 75 MG TABLET    TAKE 1 TABLET BY MOUTH ONCE DAILY TO PREVENT STROKE   HYDROXYUREA  (HYDREA ) 500 MG CAPSULE    TAKE 1 CAPSULE (500 MG TOTAL) BY MOUTH 4 (FOUR) TIMES A WEEK.   LORATADINE (CLARITIN) 10 MG TABLET    Take 10 mg by mouth daily.   MULTIPLE VITAMINS-MINERALS (MULTIVITAMIN ADULT) TABS    Take 1 tablet by mouth daily   PANTOPRAZOLE  (PROTONIX ) 40 MG TABLET    TAKE 1 TABLET BY MOUTH EVERY DAY   TRAZODONE  (DESYREL ) 50 MG TABLET    Take 0.5-1 tablets (25-50 mg total) by mouth at bedtime.   TRIAMTERENE -HYDROCHLOROTHIAZIDE (MAXZIDE-25) 37.5-25 MG TABLET    TAKE 1/2 TABLET BY MOUTH DAILY   VITAMIN C (ASCORBIC ACID) 500 MG TABLET    Take 500 mg by mouth daily.  Modified Medications   No medications on file  Discontinued Medications   No medications on file    Physical Exam:  Vitals:   02/09/24 1037  BP: 122/62  Pulse: 74  Resp: 16  Temp:  (!) 97.2 F (36.2 C)  SpO2: 94%  Weight: 151 lb (68.5 kg)  Height: 5' 7 (1.702 m)   Body mass index is 23.65 kg/m. Wt Readings from Last 3 Encounters:  02/09/24 151 lb (68.5 kg)  01/22/24 149 lb 3.2 oz (67.7 kg)  09/30/23 152 lb 1.6 oz (69 kg)    Physical Exam Vitals reviewed.  Constitutional:      General: She is not in acute distress. HENT:     Head: Normocephalic.  Eyes:     General:        Right  eye: No discharge.        Left eye: No discharge.  Cardiovascular:     Rate and Rhythm: Normal rate and regular rhythm.     Pulses: Normal pulses.     Heart sounds: Normal heart sounds.  Pulmonary:     Effort: Pulmonary effort is normal. No respiratory distress.     Breath sounds: Normal breath sounds. No wheezing, rhonchi or rales.  Abdominal:     General: Bowel sounds are normal.     Palpations: Abdomen is soft.  Musculoskeletal:     Cervical back: Neck supple.     Right lower leg: No edema.     Left lower leg: No edema.  Skin:    General: Skin is warm.     Capillary Refill: Capillary refill takes less than 2 seconds.  Neurological:     General: No focal deficit present.     Mental Status: She is alert and oriented to person, place, and time.  Psychiatric:        Mood and Affect: Mood normal.     Labs reviewed: Basic Metabolic Panel: Recent Labs    01/29/24 0728  NA 139  K 4.4  CL 101  CO2 31  GLUCOSE 97  BUN 14  CREATININE 0.81  CALCIUM  9.8   Liver Function Tests: Recent Labs    01/29/24 0728  AST 18  ALT 15  BILITOT 0.7  PROT 6.7   No results for input(s): LIPASE, AMYLASE in the last 8760 hours. No results for input(s): AMMONIA in the last 8760 hours. CBC: Recent Labs    03/31/23 1108 09/30/23 1116 01/29/24 0728  WBC 8.6 7.9 7.9  NEUTROABS 6.1 5.7 5,633  HGB 13.2 14.3 14.1  HCT 39.0 43.1 43.0  MCV 98.5 93.9 91.9  PLT 698* 655* 740*   Lipid Panel: Recent Labs    01/29/24 0728  CHOL 109  HDL 64  LDLCALC 26  TRIG 106   CHOLHDL 1.7   TSH: No results for input(s): TSH in the last 8760 hours. A1C: No results found for: HGBA1C   Assessment/Plan 1. Cough, unspecified type (Primary) - onset x 2-3 weeks - exam unremarkable - 2019 PFT indicated COPD  - 2019 Lexiscan  noted moderate perfusion defect in the anterior septal wall with differential including mild reversible ischemia versus variable breast attenuation, LVEF 62%  - not following cardiology or pulmonary at this time> will re refer to pulmonary  - 2021 EKG> NSR no ischemia - ? Worsening COPD  - will order BNP and d-dimer to r/o CHF/PE (h/o thrombocytosis) - if no improvement recommend CXR/ EKG - POC COVID-19> negative - POC Influenza A/B> negative  2. Dyspnea, unspecified type - see above - will start Advair daily> albuterol  recommended in past> not using - POC COVID-19 - POC Influenza A/B  Total time: 31 minutes. Greater than 50% of total time spent doing patient education regarding dry cough and shortness of breath including symptom/medication management.      Next appt: Visit date not found  Laquia Rosano Gil BODILY  [facility] & [facility] Medicine [phone]

## 2024-02-09 NOTE — Telephone Encounter (Signed)
 FYI Only or Action Required?: FYI only for provider.  Patient was last seen in primary care on 01/22/2024 by Andrea Harlene POUR, NP.  Called Nurse Triage reporting Shortness of Breath.  Symptoms began several weeks ago.  Triage Disposition: See HCP Within 4 Hours (Or PCP Triage)  Patient/caregiver understands and will follow disposition?: Yes             Copied from CRM #[id number]. Topic: Clinical - Red Word Triage >> Feb 09, 2024  8:37 AM Carrielelia G wrote: [facility] that prompted transfer to Nurse Triage: SOB, coughing Reason for Disposition  [1] MILD difficulty breathing (e.g., minimal/no SOB at rest, SOB with walking, pulse < 100) AND [2] still present when not coughing  Answer Assessment - Initial Assessment Questions This RN scheduled pt for an appt this morning with a different provider as PCP has no availability today.   Dry cough for last weeks SOB for last 3 weeks; with activity Intermittent headache, sore throat  Denies fever, chest pain, wheezing  Protocols used: Cough - Acute Non-Productive-A-AH

## 2024-02-10 ENCOUNTER — Ambulatory Visit: Payer: Self-pay | Admitting: Orthopedic Surgery

## 2024-02-10 LAB — CBC WITH DIFFERENTIAL/PLATELET
Absolute Lymphocytes: 1105 {cells}/uL (ref 850–3900)
Absolute Monocytes: 655 {cells}/uL (ref 200–950)
Basophils Absolute: 102 {cells}/uL (ref 0–200)
Basophils Relative: 1.2 %
Eosinophils Absolute: 145 {cells}/uL (ref 15–500)
Eosinophils Relative: 1.7 %
HCT: 40.3 % (ref 35.0–45.0)
Hemoglobin: 13 g/dL (ref 11.7–15.5)
MCH: 29.2 pg (ref 27.0–33.0)
MCHC: 32.3 g/dL (ref 32.0–36.0)
MCV: 90.6 fL (ref 80.0–100.0)
MPV: 8.2 fL (ref 7.5–12.5)
Monocytes Relative: 7.7 %
Neutro Abs: 6494 {cells}/uL (ref 1500–7800)
Neutrophils Relative %: 76.4 %
Platelets: 697 Thousand/uL — ABNORMAL HIGH (ref 140–400)
RBC: 4.45 Million/uL (ref 3.80–5.10)
RDW: 15.2 % — ABNORMAL HIGH (ref 11.0–15.0)
Total Lymphocyte: 13 %
WBC: 8.5 Thousand/uL (ref 3.8–10.8)

## 2024-02-10 LAB — D-DIMER, QUANTITATIVE: D-Dimer, Quant: 0.49 ug{FEU}/mL (ref <0.50)

## 2024-02-10 LAB — BRAIN NATRIURETIC PEPTIDE: Brain Natriuretic Peptide: 55 pg/mL (ref <100)

## 2024-02-11 ENCOUNTER — Encounter: Payer: Self-pay | Admitting: Pulmonary Disease

## 2024-02-11 ENCOUNTER — Ambulatory Visit: Admitting: Pulmonary Disease

## 2024-02-11 ENCOUNTER — Ambulatory Visit
Admission: RE | Admit: 2024-02-11 | Discharge: 2024-02-11 | Disposition: A | Source: Ambulatory Visit | Attending: Pulmonary Disease

## 2024-02-11 ENCOUNTER — Ambulatory Visit
Admission: RE | Admit: 2024-02-11 | Discharge: 2024-02-11 | Disposition: A | Discharge status: Home / Self Care | Attending: Pulmonary Disease | Admitting: Pulmonary Disease

## 2024-02-11 VITALS — BP 110/60 | HR 80 | Temp 97.9°F | Ht 67.0 in | Wt 152.0 lb

## 2024-02-11 DIAGNOSIS — R918 Other nonspecific abnormal finding of lung field: Secondary | ICD-10-CM | POA: Diagnosis not present

## 2024-02-11 DIAGNOSIS — R9389 Abnormal findings on diagnostic imaging of other specified body structures: Secondary | ICD-10-CM | POA: Diagnosis not present

## 2024-02-11 DIAGNOSIS — J449 Chronic obstructive pulmonary disease, unspecified: Secondary | ICD-10-CM

## 2024-02-11 DIAGNOSIS — R0602 Shortness of breath: Secondary | ICD-10-CM

## 2024-02-11 NOTE — Progress Notes (Signed)
 Synopsis: Referred in by Gil Greig BRAVO, NP   Subjective:   PATIENT ID: Andrea Landry GENDER: female DOB: Jun 08, 1940, MRN: [id number]  Chief Complaint  Patient presents with   Shortness of Breath    Shortness of breath on exertion 3 weeks. Occasional cough. No wheezing. Fatigue. Body shaking. Started Advair Monday night.     HPI Discussed the use of AI scribe software for clinical note transcription with the patient, who gave verbal consent to proceed.  History of Present Illness   Andrea Landry is an 83 year old female with COPD who presents with shortness of breath and fatigue.  She has been experiencing shortness of breath and fatigue for the past three weeks, occurring with minimal exertion such as brushing her teeth, washing her face, or getting into the car. She also experiences dizziness when taking a shower. No chest pain, wheezing, or leg swelling. No loss of consciousness or falls.  She reports a dry cough and occasional headaches, which she attributes to coughing. No fever or chest tightness.  She mentions a decreased appetite and occasional nausea. She also feels her memory is 'a little bit shaky,' which she attributes to the grief process following her husband's passing in July.  Her past medical history includes COPD, and she has a history of smoking a pack a day for 30 to 35 years, quitting in 1990. She denies any family history of lung diseases. She has not seen a cardiologist recently but did so in the past for facial twitching, which has since resolved.  She recently started using an Advair inhaler and has a nebulizer with albuterol  at home.      Family History  Problem Relation Age of Onset   Cancer Mother        RENAL   Other Father    Cerebral palsy Daughter    Healthy Son      Social History   Socioeconomic History   Marital status: Married    Spouse name: Not on file   Number of children: 2   Years of education: college   Highest  education level: Bachelor's degree (e.g., BA, AB, BS)  Occupational History   Occupation: retired  Tobacco Use   Smoking status: Former    Current packs/day: 0.00    Average packs/day: 1 pack/day for 29.0 years (29.0 ttl pk-yrs)    Types: Cigarettes    Start date: 89    Quit date: 1990    Years since quitting: 35.7   Smokeless tobacco: Never  Vaping Use   Vaping status: Never Used  Substance and Sexual Activity   Alcohol use: Yes    Alcohol/week: 10.0 standard drinks of alcohol    Types: 10 Standard drinks or equivalent per week    Comment: 1-2 glasses wine per night   Drug use: No   Sexual activity: Not on file  Other Topics Concern   Not on file  Social History Narrative   Lives with husband in a 2 story home.  Has 2 children.     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     Education: college.    Right handed.   Social Drivers of Corporate investment banker Strain: Low Risk  (01/18/2024)   Overall Financial Resource Strain (CARDIA)    Difficulty of Paying Living Expenses: Not very hard  Food Insecurity: No Food Insecurity (01/18/2024)   Hunger Vital Sign    Worried About Running Out of Food in the Last  Year: Never true    Ran Out of Food in the Last Year: Never true  Transportation Needs: No Transportation Needs (01/18/2024)   PRAPARE - Administrator, Civil Service (Medical): No    Lack of Transportation (Non-Medical): No  Physical Activity: Sufficiently Active (01/18/2024)   Exercise Vital Sign    Days of Exercise per Week: 7 days    Minutes of Exercise per Session: 80 min  Stress: Stress Concern Present (01/18/2024)   [employer] of Occupational Health - Occupational Stress Questionnaire    Feeling of Stress: To some extent  Social Connections: Moderately Integrated (01/18/2024)   Social Connection and Isolation Panel    Frequency of Communication with Friends and Family: More than three times a week    Frequency of Social Gatherings with  Friends and Family: More than three times a week    Attends Religious Services: More than 4 times per year    Active Member of [employer] or Organizations: Yes    Attends Banker Meetings: More than 4 times per year    Marital Status: Widowed  Intimate Partner Violence: Not At Risk (09/04/2023)   Humiliation, Afraid, Rape, and Kick questionnaire    Fear of Current or Ex-Partner: No    Emotionally Abused: No    Physically Abused: No    Sexually Abused: No        Objective:   Vitals:   02/11/24 1316  BP: 110/60  Pulse: 80  Temp: 97.9 F (36.6 C)  TempSrc: Temporal  SpO2: 96%  Weight: 152 lb (68.9 kg)  Height: 5' 7 (1.702 m)   96% on RA BMI Readings from Last 3 Encounters:  02/11/24 23.81 kg/m  02/09/24 23.65 kg/m  01/22/24 23.37 kg/m   Wt Readings from Last 3 Encounters:  02/11/24 152 lb (68.9 kg)  02/09/24 151 lb (68.5 kg)  01/22/24 149 lb 3.2 oz (67.7 kg)    Physical Exam GEN: NAD, Healthy Appearing HEENT: Supple Neck, Reactive Pupils, EOMI  CVS: Normal S1, Normal S2, RRR, No murmurs or ES appreciated  Lungs: Clear bilateral air entry.  Abdomen: Soft, non tender, non distended, + BS  Extremities: Warm and well perfused, No edema  Skin: No suspicious lesions appreciated  Psych: Normal Affect  Ancillary Information   CBC    Component Value Date/Time   WBC 8.5 02/09/2024 1120   RBC 4.45 02/09/2024 1120   HGB 13.0 02/09/2024 1120   HGB 14.3 09/30/2023 1116   HGB 15.1 02/02/2020 1608   HCT 40.3 02/09/2024 1120   HCT 39.9 02/02/2020 1608   PLT 697 (H) 02/09/2024 1120   PLT 655 (H) 09/30/2023 1116   PLT 927 (HH) 02/02/2020 1608   MCV 90.6 02/09/2024 1120   MCV 93 02/02/2020 1608   MCH 29.2 02/09/2024 1120   MCHC 32.3 02/09/2024 1120   RDW 15.2 (H) 02/09/2024 1120   RDW 20.3 (H) 02/02/2020 1608   LYMPHSABS 1.3 09/30/2023 1116   MONOABS 0.7 09/30/2023 1116   EOSABS 145 02/09/2024 1120   BASOSABS 102 02/09/2024 1120       Latest Ref Rng &  Units 02/24/2018    8:48 AM  PFT Results  FVC-Pre L 2.74   FVC-Predicted Pre % 85   FVC-Post L 2.75   FVC-Predicted Post % 85   Pre FEV1/FVC % % 77   Post FEV1/FCV % % 80   FEV1-Pre L 2.12   FEV1-Predicted Pre % 87   FEV1-Post L 2.20  DLCO uncorrected ml/min/mmHg 18.71   DLCO UNC% % 64   DLVA Predicted % 80   TLC L 5.75   TLC % Predicted % 103   RV % Predicted % 112      Assessment & Plan:      #Shortness of breath and fatigue, under evaluation Shortness of breath and fatigue for three weeks, exacerbated by minimal exertion. No wheezing, chest tightness, fever, or leg swelling. No asthma history, but has COPD. Normal BNP, no heart failure symptoms. Normal hemoglobin, kidney, and liver function. Elevated platelets noted and chronic. - Order chest x-ray. - Order echocardiogram. - Order pulmonary function test. - Advise continuation of Advair inhaler. - Advise use of nebulizer with albuterol  as needed. - Instruct to go to the emergency department if symptoms worsen.  Chronic obstructive pulmonary disease (COPD)  Clinically diagnosed, no pfts on file.  COPD. Advair inhaler in use, may take up to six weeks for peak effectiveness. - Advise continuation of Advair inhaler. - Advise use of nebulizer with albuterol  as needed.    Return in about 8 weeks (around 04/07/2024).  I personally spent a total of 60 minutes in the care of the patient today including preparing to see the patient, getting/reviewing separately obtained history, performing a medically appropriate exam/evaluation, counseling and educating, placing orders, documenting clinical information in the EHR, independently interpreting results, and communicating results.   Darrin Barn, MD [facility] 02/11/2024 2:10 PM

## 2024-02-24 ENCOUNTER — Ambulatory Visit: Admitting: Nurse Practitioner

## 2024-02-24 ENCOUNTER — Encounter: Payer: Self-pay | Admitting: Nurse Practitioner

## 2024-02-24 VITALS — BP 118/64 | HR 71 | Temp 96.8°F | Ht 67.0 in | Wt 151.0 lb

## 2024-02-24 DIAGNOSIS — J449 Chronic obstructive pulmonary disease, unspecified: Secondary | ICD-10-CM | POA: Diagnosis not present

## 2024-02-24 MED ORDER — ALBUTEROL SULFATE HFA 108 (90 BASE) MCG/ACT IN AERS: 2.0000 | INHALATION_SPRAY | Freq: Four times a day (QID) | RESPIRATORY_TRACT | 2 refills | Status: AC | PRN

## 2024-02-24 NOTE — Progress Notes (Signed)
 Careteam: Patient Care Team: [name], NP as PCP - General (Geriatric Medicine) Tobie Tonita POUR, DO as Consulting Physician (Neurology) Odean Potts, MD as Consulting Physician (Hematology and Oncology) PLACE OF SERVICE:  [facility]   Advanced Directive information    Allergies  Allergen Reactions   Lisinopril Swelling   Ace Inhibitors     Other reaction(s): mild angioedema   Other Other (See Comments)    Chief Complaint  Patient presents with   Medical Management of Chronic Issues    Medical Management Chronic Issues. 2 week follow up cough. Dry Cough     HPI: Patient is a 83 y.o. female seen in today for follow up  Discussed the use of AI scribe software for clinical note transcription with the patient, who gave verbal consent to proceed.  History of Present Illness Andrea Landry is an 83 year old female who presents for a follow-up on her cough and shortness of breath.  She was seen two weeks ago in clinic due to shortness of breath and with hx of COPD, and has since she was seen she has had consult with a pulmonary specialist. A chest x-ray was performed, and she is scheduled for pulmonary function tests in November. Her shortness of breath has improved since starting Advair, although it occasionally causes nausea. She continues to experience frequent coughing fits, particularly in the morning and afternoon  She has a history of COPD and is currently using Advair, which she feels is helping with her breathing. She is also taking loratadine for allergies, which she finds more effective than Zyrtec . She has not noticed any significant changes in her symptoms related to her living environment, despite concerns about potential mold exposure.  She denies fever and chills and does not think she is wheezing. Occasional dizziness and balance issues, which have improved. She does not currently have an albuterol  inhaler but recalls having one in the  past.   Review of Systems:  Review of Systems  Constitutional:  Negative for chills, fever and weight loss.  HENT:  Negative for tinnitus.   Respiratory:  Positive for cough and shortness of breath. Negative for sputum production.   Cardiovascular:  Negative for chest pain, palpitations and leg swelling.  Gastrointestinal:  Negative for abdominal pain, constipation, diarrhea and heartburn.  Genitourinary:  Negative for dysuria, frequency and urgency.  Musculoskeletal:  Negative for back pain, falls, joint pain and myalgias.  Skin: Negative.   Neurological:  Negative for dizziness and headaches.  Psychiatric/Behavioral:  Negative for depression and memory loss. The patient does not have insomnia.     Past Medical History:  Diagnosis Date   Abnormal nuclear stress test    Cancer [facility])    BREAST CANCER   DJD (degenerative joint disease)    Knees and elbows   DOE (dyspnea on exertion)    Fibrocystic breast disease    GERD (gastroesophageal reflux disease)    History of colon polyps    Removal of polyp July 2008   Hyperlipidemia    Hypertension    Osteopenia 03/2014   T score -1.4 FRAX 10%/1.8%. No change from prior DEXA   Rosacea    Sigmoid diverticulitis    SOB (shortness of breath)    TIA (transient ischemic attack)    Past Surgical History:  Procedure Laterality Date   BREAST ENHANCEMENT SURGERY     FOLLOWING MASTECTOMY FOR BREAST CANCER   BREAST LUMPECTOMY  1991   BREAST SURGERY  MULTIPLE BREAST BIOPSIES,    MASTECTOMY     RIGHT BREAST   right hammer toe and Bunion  05/07/2011   Social History:   reports that she quit smoking about 35 years ago. Her smoking use included cigarettes. She started smoking about 64 years ago. She has a 29 pack-year smoking history. She has never used smokeless tobacco. She reports current alcohol use of about 10.0 standard drinks of alcohol per week. She reports that she does not use drugs.  Family History  Problem Relation Age of  Onset   Cancer Mother        RENAL   Other Father    Cerebral palsy Daughter    Healthy Son     Medications: Patient's Medications  New Prescriptions   No medications on file  Previous Medications   AMLODIPINE -VALSARTAN  (EXFORGE ) 5-160 MG TABLET    TAKE 1 TABLET BY MOUTH EVERY DAY   ASPIRIN  EC 81 MG TABLET    Take 1 tablet (81 mg total) by mouth daily.   ATORVASTATIN  (LIPITOR) 20 MG TABLET    TAKE 1 TABLET BY MOUTH EVERY DAY   CHOLECALCIFEROL (VITAMIN D ) 1000 UNITS TABLET    Take 1 tablet by mouth daily   CLONAZEPAM  (KLONOPIN ) 0.5 MG TABLET    TAKE 1 TABLET BY MOUTH EVERY DAY AS NEEDED FOR ANXIETY OR SLEEP F41.9 AND G47.00   CLOPIDOGREL  (PLAVIX ) 75 MG TABLET    TAKE 1 TABLET BY MOUTH ONCE DAILY TO PREVENT STROKE   FLUTICASONE-SALMETEROL (ADVAIR) 100-50 MCG/ACT AEPB    Inhale 1 puff into the lungs 2 (two) times daily.   HYDROXYUREA  (HYDREA ) 500 MG CAPSULE    TAKE 1 CAPSULE (500 MG TOTAL) BY MOUTH 4 (FOUR) TIMES A WEEK.   LORATADINE (CLARITIN) 10 MG TABLET    Take 10 mg by mouth daily.   MULTIPLE VITAMINS-MINERALS (MULTIVITAMIN ADULT) TABS    Take 1 tablet by mouth daily   PANTOPRAZOLE  (PROTONIX ) 40 MG TABLET    TAKE 1 TABLET BY MOUTH EVERY DAY   TRAZODONE  (DESYREL ) 50 MG TABLET    Take 0.5-1 tablets (25-50 mg total) by mouth at bedtime.   TRIAMTERENE -HYDROCHLOROTHIAZIDE (MAXZIDE-25) 37.5-25 MG TABLET    TAKE 1/2 TABLET BY MOUTH DAILY   VITAMIN C (ASCORBIC ACID) 500 MG TABLET    Take 500 mg by mouth daily.  Modified Medications   No medications on file  Discontinued Medications   No medications on file    Physical Exam:  Vitals:   02/24/24 1048  BP: 118/64  Pulse: 71  Temp: (!) 96.8 F (36 C)  SpO2: 96%  Weight: 151 lb (68.5 kg)  Height: 5' 7 (1.702 m)   Body mass index is 23.65 kg/m. Wt Readings from Last 3 Encounters:  02/24/24 151 lb (68.5 kg)  02/11/24 152 lb (68.9 kg)  02/09/24 151 lb (68.5 kg)    Physical Exam Constitutional:      General: She is not in  acute distress.    Appearance: She is well-developed. She is not diaphoretic.  HENT:     Head: Normocephalic and atraumatic.     Mouth/Throat:     Pharynx: No oropharyngeal exudate.  Eyes:     Conjunctiva/sclera: Conjunctivae normal.     Pupils: Pupils are equal, round, and reactive to light.  Cardiovascular:     Rate and Rhythm: Normal rate and regular rhythm.     Heart sounds: Normal heart sounds.  Pulmonary:     Effort: Pulmonary effort is normal.  Breath sounds: Normal breath sounds.  Abdominal:     General: Bowel sounds are normal. There is no distension.     Palpations: Abdomen is soft.  Musculoskeletal:     Cervical back: Normal range of motion and neck supple.     Right lower leg: No edema.     Left lower leg: No edema.  Skin:    General: Skin is warm and dry.  Neurological:     Mental Status: She is alert.  Psychiatric:        Mood and Affect: Mood normal.     Labs reviewed: Basic Metabolic Panel: Recent Labs    01/29/24 0728  NA 139  K 4.4  CL 101  CO2 31  GLUCOSE 97  BUN 14  CREATININE 0.81  CALCIUM  9.8   Liver Function Tests: Recent Labs    01/29/24 0728  AST 18  ALT 15  BILITOT 0.7  PROT 6.7   No results for input(s): LIPASE, AMYLASE in the last 8760 hours. No results for input(s): AMMONIA in the last 8760 hours. CBC: Recent Labs    09/30/23 1116 01/29/24 0728 02/09/24 1120  WBC 7.9 7.9 8.5  NEUTROABS 5.7 5,633 6,494  HGB 14.3 14.1 13.0  HCT 43.1 43.0 40.3  MCV 93.9 91.9 90.6  PLT 655* 740* 697*   Lipid Panel: Recent Labs    01/29/24 0728  CHOL 109  HDL 64  LDLCALC 26  TRIG 106  CHOLHDL 1.7   TSH: No results for input(s): TSH in the last 8760 hours. A1C: No results found for: HGBA1C   Assessment/Plan Assessment and Plan Assessment & Plan Chronic obstructive pulmonary disease (COPD) with persistent cough COPD with persistent cough and shortness of breath. Symptoms improved with Advair.  No acute lab  abnormalities.  She is followed by pulmonary at this time - Continue Advair as prescribed. - Schedule pulmonary function tests in November. - Prescribe albuterol  inhaler for acute symptoms. - Follow up with pulmonologist in December.  Allergic rhinitis Allergic rhinitis with persistent cough. Symptoms improved with loratadine. - Continue loratadine.   Next appt: 4 months for routine follow up and labs  Mads Borgmeyer K. Caro BODILY  [facility] [phone]

## 2024-03-02 ENCOUNTER — Other Ambulatory Visit: Payer: Self-pay | Admitting: Nurse Practitioner

## 2024-03-12 ENCOUNTER — Ambulatory Visit
Admission: RE | Admit: 2024-03-12 | Discharge: 2024-03-12 | Disposition: A | Discharge status: Home / Self Care | Source: Ambulatory Visit | Attending: Pulmonary Disease | Admitting: Pulmonary Disease

## 2024-03-12 DIAGNOSIS — I119 Hypertensive heart disease without heart failure: Secondary | ICD-10-CM | POA: Insufficient documentation

## 2024-03-12 DIAGNOSIS — R0609 Other forms of dyspnea: Secondary | ICD-10-CM | POA: Diagnosis not present

## 2024-03-12 DIAGNOSIS — I3481 Nonrheumatic mitral (valve) annulus calcification: Secondary | ICD-10-CM | POA: Diagnosis not present

## 2024-03-12 DIAGNOSIS — R0602 Shortness of breath: Secondary | ICD-10-CM | POA: Insufficient documentation

## 2024-03-12 LAB — ECHOCARDIOGRAM COMPLETE
Area-P 1/2: 3.72 cm2
S' Lateral: 2.6 cm

## 2024-03-12 NOTE — Progress Notes (Signed)
  Echocardiogram 2D Echocardiogram has been performed.  Andrea Landry 03/12/2024, 10:31 AM

## 2024-03-25 ENCOUNTER — Inpatient Hospital Stay: Admitting: Hematology and Oncology

## 2024-03-25 ENCOUNTER — Inpatient Hospital Stay: Attending: Hematology and Oncology

## 2024-03-25 VITALS — BP 124/53 | HR 80 | Temp 97.7°F | Resp 17 | Ht 67.0 in | Wt 150.8 lb

## 2024-03-25 DIAGNOSIS — D473 Essential (hemorrhagic) thrombocythemia: Secondary | ICD-10-CM

## 2024-03-25 DIAGNOSIS — D75839 Thrombocytosis, unspecified: Secondary | ICD-10-CM | POA: Insufficient documentation

## 2024-03-25 LAB — CBC WITH DIFFERENTIAL (CANCER CENTER ONLY)
Abs Immature Granulocytes: 0.04 K/uL (ref 0.00–0.07)
Basophils Absolute: 0.2 K/uL — ABNORMAL HIGH (ref 0.0–0.1)
Basophils Relative: 1 %
Eosinophils Absolute: 0.2 K/uL (ref 0.0–0.5)
Eosinophils Relative: 2 %
HCT: 36.7 % (ref 36.0–46.0)
Hemoglobin: 12.1 g/dL (ref 12.0–15.0)
Immature Granulocytes: 0 %
Lymphocytes Relative: 13 %
Lymphs Abs: 1.4 K/uL (ref 0.7–4.0)
MCH: 30.7 pg (ref 26.0–34.0)
MCHC: 33 g/dL (ref 30.0–36.0)
MCV: 93.1 fL (ref 80.0–100.0)
Monocytes Absolute: 0.8 K/uL (ref 0.1–1.0)
Monocytes Relative: 8 %
Neutro Abs: 8 K/uL — ABNORMAL HIGH (ref 1.7–7.7)
Neutrophils Relative %: 76 %
Platelet Count: 701 K/uL — ABNORMAL HIGH (ref 150–400)
RBC: 3.94 MIL/uL (ref 3.87–5.11)
RDW: 15.4 % (ref 11.5–15.5)
WBC Count: 10.6 K/uL — ABNORMAL HIGH (ref 4.0–10.5)
nRBC: 0 % (ref 0.0–0.2)

## 2024-03-25 NOTE — Progress Notes (Signed)
 Patient Care Team: [name], NP as PCP - General (Geriatric Medicine) Patel, Donika K, DO as Consulting Physician (Neurology) Odean Potts, MD as Consulting Physician (Hematology and Oncology)  DIAGNOSIS:  Encounter Diagnosis  Name Primary?   Essential thrombocytosis (HCC) Yes   CHIEF COMPLIANT: Follow-up essential thrombocytosis on Hydrea   HISTORY OF PRESENT ILLNESS:  History of Present Illness Andrea Landry is an 83 year old female with essential thrombocythemia who presents for routine follow-up.  Her platelet count is 701, consistent with her usual range. She is on Hydrea  four times a week. She experiences fatigue, which she associates with recent personal stress. She denies nausea or respiratory issues.     ALLERGIES:  is allergic to lisinopril, ace inhibitors, and other.  MEDICATIONS:  Current Outpatient Medications  Medication Sig Dispense Refill   albuterol  (VENTOLIN  HFA) 108 (90 Base) MCG/ACT inhaler Inhale 2 puffs into the lungs every 6 (six) hours as needed for wheezing or shortness of breath. 8 g 2   amLODipine -valsartan  (EXFORGE ) 5-160 MG tablet TAKE 1 TABLET BY MOUTH EVERY DAY 90 tablet 1   aspirin  EC 81 MG tablet Take 1 tablet (81 mg total) by mouth daily.     atorvastatin  (LIPITOR) 20 MG tablet TAKE 1 TABLET BY MOUTH EVERY DAY 90 tablet 1   cholecalciferol (VITAMIN D ) 1000 units tablet Take 1 tablet by mouth daily     clonazePAM  (KLONOPIN ) 0.5 MG tablet TAKE 1 TABLET BY MOUTH EVERY DAY AS NEEDED FOR ANXIETY OR SLEEP F41.9 AND G47.00 30 tablet 1   clopidogrel  (PLAVIX ) 75 MG tablet TAKE 1 TABLET BY MOUTH ONCE DAILY TO PREVENT STROKE 90 tablet 1   fluticasone -salmeterol (ADVAIR) 100-50 MCG/ACT AEPB Inhale 1 puff into the lungs 2 (two) times daily. 1 each 3   hydroxyurea  (HYDREA ) 500 MG capsule TAKE 1 CAPSULE (500 MG TOTAL) BY MOUTH 4 (FOUR) TIMES A WEEK. 48 capsule 3   loratadine (CLARITIN) 10 MG tablet Take 10 mg by mouth daily.     Multiple  Vitamins-Minerals (MULTIVITAMIN ADULT) TABS Take 1 tablet by mouth daily     pantoprazole  (PROTONIX ) 40 MG tablet TAKE 1 TABLET BY MOUTH EVERY DAY 90 tablet 3   traZODone  (DESYREL ) 50 MG tablet Take 0.5-1 tablets (25-50 mg total) by mouth at bedtime. 90 tablet 1   triamterene -hydrochlorothiazide (MAXZIDE-25) 37.5-25 MG tablet TAKE 1/2 TABLET BY MOUTH DAILY 45 tablet 3   vitamin C (ASCORBIC ACID) 500 MG tablet Take 500 mg by mouth daily.     No current facility-administered medications for this visit.    PHYSICAL EXAMINATION: ECOG PERFORMANCE STATUS: 1 - Symptomatic but completely ambulatory  Vitals:   03/25/24 1430  BP: (!) 124/53  Pulse: 80  Resp: 17  Temp: 97.7 F (36.5 C)  SpO2: 96%   Filed Weights   03/25/24 1430  Weight: 150 lb 12.8 oz (68.4 kg)    LABORATORY DATA:  I have reviewed the data as listed    Latest Ref Rng & Units 01/29/2024    7:28 AM 01/27/2023    7:45 AM 08/29/2022   12:00 AM  CMP  Glucose 65 - 99 mg/dL 97  79    BUN 7 - 25 mg/dL 14  15  17    Creatinine 0.60 - 0.95 mg/dL 9.18  9.28  0.9   Sodium 135 - 146 mmol/L 139  142  139   Potassium 3.5 - 5.3 mmol/L 4.4  4.7  4.9   Chloride 98 - 110 mmol/L  101  104  101   CO2 20 - 32 mmol/L 31  31  32   Calcium  8.6 - 10.4 mg/dL 9.8  9.4  9.7   Total Protein 6.1 - 8.1 g/dL 6.7  6.4    Total Bilirubin 0.2 - 1.2 mg/dL 0.7  0.5    Alkaline Phos 25 - 125   86   AST 10 - 35 U/L 18  20  22    ALT 6 - 29 U/L 15  22  18      Lab Results  Component Value Date   WBC 10.6 (H) 03/25/2024   HGB 12.1 03/25/2024   HCT 36.7 03/25/2024   MCV 93.1 03/25/2024   PLT 701 (H) 03/25/2024   NEUTROABS 8.0 (H) 03/25/2024    ASSESSMENT & PLAN:  Essential thrombocytosis (HCC) 07/31/2016: hemoglobin 15.3, Platelet count 657 02/02/2020: Hemoglobin 13.1, platelets 927 03/15/20: Hb 12.2, Pl 609 03/19/2021: Hemoglobin 12.4, platelets 544 09/24/2021: Hemoglobin 13.2, platelets 570 09/26/2022: Hemoglobin 13.6, platelets 604 02/09/2024:  Hemoglobin 13, platelets 697 (were 740 on 01/29/2024) 03/25/2024: WBC 701   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   Treatment plan: 1. Aspirin  81 mg daily started April 2021 2. hydroxyurea  500 mg daily switched to 4 days a week on 08/14/2020   Hydrea  toxicity: Hair thinning She golfs twice a week at [facility] Her husband unfortunately is not doing well and is at home with hospice (COPD)   Responding to hydrea  and tolerating it well. Return to clinic in 6 months with labs and follow-up    No orders of the defined types were placed in this encounter.  The patient has a good understanding of the overall plan. she agrees with it. she will call with any problems that may develop before the next visit here.  I personally spent a total of 30 minutes in the care of the patient today including preparing to see the patient, getting/reviewing separately obtained history, performing a medically appropriate exam/evaluation, counseling and educating, placing orders, referring and communicating with other health care professionals, documenting clinical information in the EHR, independently interpreting results, communicating results, and coordinating care.   Viinay K Charlis Harner, MD 03/25/24

## 2024-03-25 NOTE — Assessment & Plan Note (Signed)
 07/31/2016: hemoglobin 15.3, Platelet count 657 02/02/2020: Hemoglobin 13.1, platelets 927 03/15/20: Hb 12.2, Pl 609 03/19/2021: Hemoglobin 12.4, platelets 544 09/24/2021: Hemoglobin 13.2, platelets 570 09/26/2022: Hemoglobin 13.6, platelets 604 02/09/2024: Hemoglobin 13, platelets 697 (were 740 on 01/29/2024)   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   Treatment plan: 1. Aspirin  81 mg daily started April 2021 2. hydroxyurea  500 mg daily switched to 4 days a week on 08/14/2020   Hydrea  toxicity: Hair thinning She golfs twice a week at [employer] Her husband unfortunately is not doing well and is at home with hospice (COPD)   Responding to hydrea  and tolerating it well. Return to clinic in 6 months with labs and follow-up

## 2024-04-14 ENCOUNTER — Telehealth: Payer: Self-pay

## 2024-04-14 NOTE — Telephone Encounter (Signed)
 Noted. Nothing further needed.

## 2024-04-14 NOTE — Telephone Encounter (Signed)
 Echo was completed on 11/7.

## 2024-04-14 NOTE — Telephone Encounter (Signed)
 Copied from CRM #[id number]. Topic: Clinical - Lab/Test Results >> Apr 14, 2024  9:23 AM Russell PARAS wrote: Reason for CRM:   Pt is reaching out to clinic regarding echocardiogram results, performed on 03/12/2024. She has not been contacted by clinic to review results.   Requested call back  CB#  [phone]

## 2024-04-26 ENCOUNTER — Ambulatory Visit: Admitting: Pulmonary Disease

## 2024-04-26 ENCOUNTER — Encounter

## 2024-04-28 ENCOUNTER — Other Ambulatory Visit: Payer: Self-pay | Admitting: Nurse Practitioner

## 2024-04-28 NOTE — Telephone Encounter (Signed)
 High risk or very high risk warning populated when attempting to refill medication. RX request sent to PCP for review and approval if warranted.

## 2024-06-08 ENCOUNTER — Other Ambulatory Visit: Payer: Self-pay | Admitting: Nurse Practitioner

## 2024-06-24 ENCOUNTER — Encounter

## 2024-06-24 ENCOUNTER — Ambulatory Visit: Admitting: Pulmonary Disease

## 2024-07-06 ENCOUNTER — Ambulatory Visit: Payer: Self-pay | Admitting: Nurse Practitioner

## 2024-09-16 ENCOUNTER — Ambulatory Visit: Payer: Self-pay | Admitting: Nurse Practitioner

## 2024-09-22 ENCOUNTER — Inpatient Hospital Stay

## 2024-09-22 ENCOUNTER — Inpatient Hospital Stay: Admitting: Hematology and Oncology

## 2025-01-20 ENCOUNTER — Ambulatory Visit: Payer: Self-pay | Admitting: Nurse Practitioner
